# Patient Record
Sex: Female | Born: 1935 | Race: White | Hispanic: No | Smoking: Never smoker
Health system: Southern US, Community
[De-identification: ages and names within clinical notes are randomized; demographics above are authoritative.]

## PROBLEM LIST (undated history)

## (undated) DIAGNOSIS — I639 Cerebral infarction, unspecified: Secondary | ICD-10-CM

## (undated) DIAGNOSIS — I809 Phlebitis and thrombophlebitis of unspecified site: Secondary | ICD-10-CM

## (undated) DIAGNOSIS — F0390 Unspecified dementia without behavioral disturbance: Secondary | ICD-10-CM

## (undated) DIAGNOSIS — M1990 Unspecified osteoarthritis, unspecified site: Secondary | ICD-10-CM

## (undated) DIAGNOSIS — K5900 Constipation, unspecified: Secondary | ICD-10-CM

## (undated) DIAGNOSIS — E079 Disorder of thyroid, unspecified: Secondary | ICD-10-CM

## (undated) DIAGNOSIS — I82409 Acute embolism and thrombosis of unspecified deep veins of unspecified lower extremity: Secondary | ICD-10-CM

## (undated) HISTORY — PX: ABDOMINAL HYSTERECTOMY: SHX81

---

## 2014-07-12 ENCOUNTER — Emergency Department (HOSPITAL_COMMUNITY)
Admission: EM | Admit: 2014-07-12 | Discharge: 2014-07-13 | Disposition: A | Discharge status: Home / Self Care | Payer: Medicare Other | Attending: Emergency Medicine | Admitting: Emergency Medicine

## 2014-07-12 ENCOUNTER — Encounter (HOSPITAL_COMMUNITY): Payer: Self-pay | Admitting: Emergency Medicine

## 2014-07-12 DIAGNOSIS — Z7982 Long term (current) use of aspirin: Secondary | ICD-10-CM | POA: Diagnosis not present

## 2014-07-12 DIAGNOSIS — Z8679 Personal history of other diseases of the circulatory system: Secondary | ICD-10-CM | POA: Insufficient documentation

## 2014-07-12 DIAGNOSIS — M7989 Other specified soft tissue disorders: Secondary | ICD-10-CM | POA: Diagnosis not present

## 2014-07-12 DIAGNOSIS — E079 Disorder of thyroid, unspecified: Secondary | ICD-10-CM | POA: Diagnosis not present

## 2014-07-12 DIAGNOSIS — Z880 Allergy status to penicillin: Secondary | ICD-10-CM | POA: Insufficient documentation

## 2014-07-12 DIAGNOSIS — Z8673 Personal history of transient ischemic attack (TIA), and cerebral infarction without residual deficits: Secondary | ICD-10-CM | POA: Diagnosis not present

## 2014-07-12 DIAGNOSIS — F0390 Unspecified dementia without behavioral disturbance: Secondary | ICD-10-CM | POA: Diagnosis not present

## 2014-07-12 DIAGNOSIS — M129 Arthropathy, unspecified: Secondary | ICD-10-CM | POA: Insufficient documentation

## 2014-07-12 DIAGNOSIS — Z8719 Personal history of other diseases of the digestive system: Secondary | ICD-10-CM | POA: Diagnosis not present

## 2014-07-12 DIAGNOSIS — Z79899 Other long term (current) drug therapy: Secondary | ICD-10-CM | POA: Insufficient documentation

## 2014-07-12 HISTORY — DX: Unspecified dementia, unspecified severity, without behavioral disturbance, psychotic disturbance, mood disturbance, and anxiety: F03.90

## 2014-07-12 HISTORY — DX: Phlebitis and thrombophlebitis of unspecified site: I80.9

## 2014-07-12 HISTORY — DX: Cerebral infarction, unspecified: I63.9

## 2014-07-12 HISTORY — DX: Disorder of thyroid, unspecified: E07.9

## 2014-07-12 HISTORY — DX: Constipation, unspecified: K59.00

## 2014-07-12 HISTORY — DX: Unspecified osteoarthritis, unspecified site: M19.90

## 2014-07-12 LAB — COMPREHENSIVE METABOLIC PANEL
ALT: 18 U/L (ref 0–35)
AST: 16 U/L (ref 0–37)
Albumin: 3.4 g/dL — ABNORMAL LOW (ref 3.5–5.2)
Alkaline Phosphatase: 80 U/L (ref 39–117)
Anion gap: 12 (ref 5–15)
BUN: 18 mg/dL (ref 6–23)
CO2: 25 mEq/L (ref 19–32)
Calcium: 9.5 mg/dL (ref 8.4–10.5)
Chloride: 102 mEq/L (ref 96–112)
Creatinine, Ser: 1.14 mg/dL — ABNORMAL HIGH (ref 0.50–1.10)
GFR calc Af Amer: 49 mL/min — ABNORMAL LOW (ref >90)
GFR calc non Af Amer: 42 mL/min — ABNORMAL LOW (ref >90)
Glucose, Bld: 118 mg/dL — ABNORMAL HIGH (ref 70–99)
Potassium: 4.6 mEq/L (ref 3.7–5.3)
Sodium: 139 mEq/L (ref 137–147)
Total Bilirubin: 0.4 mg/dL (ref 0.3–1.2)
Total Protein: 7.1 g/dL (ref 6.0–8.3)

## 2014-07-12 LAB — CBC WITH DIFFERENTIAL/PLATELET
Basophils Absolute: 0 10*3/uL (ref 0.0–0.1)
Basophils Relative: 0 % (ref 0–1)
Eosinophils Absolute: 0.3 10*3/uL (ref 0.0–0.7)
Eosinophils Relative: 2 % (ref 0–5)
HCT: 38.6 % (ref 36.0–46.0)
Hemoglobin: 13.3 g/dL (ref 12.0–15.0)
Lymphocytes Relative: 21 % (ref 12–46)
Lymphs Abs: 3.9 10*3/uL (ref 0.7–4.0)
MCH: 28.8 pg (ref 26.0–34.0)
MCHC: 34.5 g/dL (ref 30.0–36.0)
MCV: 83.5 fL (ref 78.0–100.0)
Monocytes Absolute: 1.6 10*3/uL — ABNORMAL HIGH (ref 0.1–1.0)
Monocytes Relative: 9 % (ref 3–12)
Neutro Abs: 12.5 10*3/uL — ABNORMAL HIGH (ref 1.7–7.7)
Neutrophils Relative %: 68 % (ref 43–77)
Platelets: 266 10*3/uL (ref 150–400)
RBC: 4.62 MIL/uL (ref 3.87–5.11)
RDW: 14.7 % (ref 11.5–15.5)
WBC: 18.4 10*3/uL — ABNORMAL HIGH (ref 4.0–10.5)

## 2014-07-12 LAB — D-DIMER, QUANTITATIVE: D-Dimer, Quant: 3.76 ug/mL-FEU — ABNORMAL HIGH (ref 0.00–0.48)

## 2014-07-12 MED ORDER — DOXYCYCLINE HYCLATE 100 MG PO CAPS: 100.0000 mg | ORAL_CAPSULE | Freq: Two times a day (BID) | ORAL | Status: DC

## 2014-07-12 MED ORDER — CLINDAMYCIN HCL 300 MG PO CAPS
300.0000 mg | ORAL_CAPSULE | Freq: Once | ORAL | Status: AC
  Administered 2014-07-12: 300 mg via ORAL
  Filled 2014-07-12: qty 1

## 2014-07-12 MED ORDER — CLINDAMYCIN HCL 150 MG PO CAPS: 150.0000 mg | ORAL_CAPSULE | Freq: Four times a day (QID) | ORAL | Status: DC

## 2014-07-12 NOTE — ED Provider Notes (Signed)
CSN: [id number]     Arrival date & time 07/12/14  2130 History   First MD Initiated Contact with Patient 07/12/14 2141     Chief Complaint  Patient presents with  . Joint Swelling    right     (Consider location/radiation/quality/duration/timing/severity/associated sxs/prior Treatment) HPI  Laura Bird with R foot/leg swelling, erythema. No known trauma. Gradual onset ~3 days ago and progressively worsening. Pt has hx of dementia and unclear if having pain. No reported fever. No cough. Past hx lists thrombophlebitis. No old records in our system. Family with some past medical records. I didn't locate further details in cursory review.   Past Medical History  Diagnosis Date  . Arthritis   . Dementia   . Stroke   . Constipation   . Thrombophlebitis   . Thyroid disease     hyperpara  . DJD (degenerative joint disease)    Past Surgical History  Procedure Laterality Date  . Abdominal hysterectomy     No family history on file. History  Substance Use Topics  . Smoking status: Never Smoker   . Smokeless tobacco: Never Used  . Alcohol Use: No   OB History   Grav Para Term Preterm Abortions TAB SAB Ect Mult Living                 Review of Systems  Level 5 caveat because of dementia  Allergies  Codeine and Penicillins  Home Medications   Prior to Admission medications   Medication Sig Start Date End Date Taking? Authorizing Provider  acetaminophen (TYLENOL) 650 MG CR tablet Take 650 mg by mouth every 8 (eight) hours as needed for pain.   Yes Historical Provider, MD  aspirin EC 81 MG tablet Take 81 mg by mouth daily.   Yes Historical Provider, MD  calcium carbonate (TUMS - DOSED IN MG ELEMENTAL CALCIUM) 500 MG chewable tablet Chew 1 tablet by mouth 2 (two) times daily.   Yes Historical Provider, MD  cholecalciferol (VITAMIN D) 1000 UNITS tablet Take 1,000 Units by mouth daily.   Yes Historical Provider, MD  levothyroxine (SYNTHROID, LEVOTHROID) 112 MCG tablet Take 112 mcg by  mouth daily before breakfast. Monday, Wednesday, Friday and Saturday.   Yes Historical Provider, MD  loratadine (CLARITIN) 10 MG tablet Take 10 mg by mouth daily.   Yes Historical Provider, MD  LORazepam (ATIVAN) 0.5 MG tablet Take 0.5 mg by mouth every 8 (eight) hours as needed for anxiety.   Yes Historical Provider, MD  Melatonin 3 MG CAPS Take 3 mg by mouth at bedtime.   Yes Historical Provider, MD  Multiple Vitamin (MULTIVITAMIN WITH MINERALS) TABS tablet Take 1 tablet by mouth 2 (two) times daily.   Yes Historical Provider, MD  Omega-3 Fatty Acids (FISH OIL) 1200 MG CAPS Take 1,200 mg by mouth daily.   Yes Historical Provider, MD  polyethylene glycol (MIRALAX / GLYCOLAX) packet Take 17 g by mouth daily.   Yes Historical Provider, MD  sertraline (ZOLOFT) 50 MG tablet Take 50 mg by mouth daily.   Yes Historical Provider, MD  simvastatin (ZOCOR) 20 MG tablet Take 20 mg by mouth daily.   Yes Historical Provider, MD  Turmeric Curcumin 500 MG CAPS Take 500 mg by mouth daily.   Yes Historical Provider, MD  vitamin E 400 UNIT capsule Take 400 Units by mouth daily.   Yes Historical Provider, MD   BP 167/89  Pulse 96  Temp(Src) 97.6 F (36.4 C) (Oral)  Resp 16  SpO2 95%  Physical Exam  Nursing note and vitals reviewed. Constitutional: She appears well-developed and well-nourished. She appears distressed.  HENT:  Head: Normocephalic and atraumatic.  Eyes: Conjunctivae are normal. Right eye exhibits no discharge. Left eye exhibits no discharge.  Neck: Neck supple.  Cardiovascular: Normal rate, regular rhythm and normal heart sounds.  Exam reveals no gallop and no friction rub.   No murmur heard. Pulmonary/Chest: Effort normal and breath sounds normal. No respiratory distress.  Abdominal: Soft. She exhibits no distension. There is no tenderness.  Musculoskeletal: She exhibits no edema and no tenderness.  Skin: Skin is warm and dry.  Faint erythema to R foot and distal shin. Poorly demarcated.   Increased warmth.  Swelling from calf distally. Doesn't seem tender? Will occasionally withdraw foot during exam but does the same when examining the L side as well. Feet appear well perfused. Palpable DP pulses.   Psychiatric: She has a normal mood and affect.    ED Course  Procedures (including critical care time) Labs Review Labs Reviewed  COMPREHENSIVE METABOLIC PANEL - Abnormal; Notable for the following:    Glucose, Bld 118 (*)    Creatinine, Ser 1.14 (*)    Albumin 3.4 (*)    GFR calc non Af Amer 42 (*)    GFR calc Af Amer 49 (*)    All other components within normal limits  CBC WITH DIFFERENTIAL - Abnormal; Notable for the following:    WBC 18.4 (*)    Neutro Abs 12.5 (*)    Monocytes Absolute 1.6 (*)    All other components within normal limits  D-DIMER, QUANTITATIVE - Abnormal; Notable for the following:    D-Dimer, Quant 3.76 (*)    All other components within normal limits  TSH    Imaging Review No results found.   EKG Interpretation None      MDM   Final diagnoses:  Right leg swelling    Laura Bird with atraumatic RLE erythema, swelling. DVT versus cellulitis. Unfortunately unable to obtain US at this hour. Family understandably frustrated. Spoke with PCP, Dr Herschel [place]. Very difficult to get pt out of house and actually seen during house calls. Will get a d-dimer. Could potentially avoid [place] and can additionally obtain labs PCP requesting. Family understands needs [place] if dimer elevated. +/- on empiric lovenox until then.  Will cover with abx for possible cellulitis although not clinically convincing. PCP requesting xarelto if subsequent [place] comes back positive because will be unable to feasibly monitor INR. With age/signs of possible DVT cannot use Well's/PERC with regards to possible PE. With dementia, unable to get any reliable ROS. O2 sats ok. Resting HR somewhat high in 90s, but no tachycardia. Clinically she does not appear distressed.   Leukocytosis.  Nonspecific. Covering with abx though. No signs of abscess. Clindamycin for adequate strep coverage with her PCN allergy.   D-dimer significantly elevated. To get [place] tomorrow morning at [facility].     Raeford Razor, MD 07/12/14 [phone]

## 2014-07-12 NOTE — ED Notes (Signed)
Per EMS pt started having R ankle/calf swelling 3 days ago, states this happens at times, called MD today and was told to come here and get an Xray, was told the order had already been called in, denies any fall or injury to ankle. Pt has hx of dementia. Dr. Erick Blinks is who called in xray order.

## 2014-07-12 NOTE — Discharge Instructions (Signed)
Go to [facility] tomorrow for ultrasound of your right leg. Take antibiotics as prescribed.

## 2014-07-12 NOTE — ED Notes (Signed)
Bed: ZO10 Expected date:  Expected time:  Means of arrival:  Comments: EMS ankle fx - elderly

## 2014-07-12 NOTE — ED Notes (Signed)
Per family, pt was sent here to have a doppler study of R lower extremity, pt does have swelling and redness noted to R ankle, warm to touch.

## 2014-07-13 ENCOUNTER — Ambulatory Visit (HOSPITAL_COMMUNITY)
Admission: RE | Admit: 2014-07-13 | Discharge: 2014-07-13 | Disposition: A | Discharge status: Home / Self Care | Payer: Medicare Other | Source: Ambulatory Visit | Attending: Emergency Medicine | Admitting: Emergency Medicine

## 2014-07-13 DIAGNOSIS — I803 Phlebitis and thrombophlebitis of lower extremities, unspecified: Secondary | ICD-10-CM | POA: Insufficient documentation

## 2014-07-13 LAB — TSH: TSH: 5.62 u[IU]/mL — ABNORMAL HIGH (ref 0.350–4.500)

## 2014-07-13 NOTE — Progress Notes (Signed)
VASCULAR LAB PRELIMINARY  PRELIMINARY  PRELIMINARY  PRELIMINARY  Right lower extremity venous duplex completed.    Preliminary report:  Right:  DVT noted from the mid femoral vein through the popliteal vein and in the posterior tibial vein.  Unable to evaluate peroneal vein adequately due to patient non compliance.  No evidence of superficial thrombosis.  No Baker's cyst.  Maile Linford, RVT 07/13/2014, 10:55 AM

## 2015-12-23 ENCOUNTER — Emergency Department (HOSPITAL_COMMUNITY): Payer: Medicare Other

## 2015-12-23 ENCOUNTER — Encounter (HOSPITAL_COMMUNITY): Payer: Self-pay

## 2015-12-23 ENCOUNTER — Inpatient Hospital Stay (HOSPITAL_COMMUNITY)
Admission: EM | Admit: 2015-12-23 | Discharge: 2015-12-26 | DRG: 871 | Disposition: A | Discharge status: Home / Self Care | Payer: Medicare Other | Attending: Internal Medicine | Admitting: Internal Medicine

## 2015-12-23 DIAGNOSIS — B9620 Unspecified Escherichia coli [E. coli] as the cause of diseases classified elsewhere: Secondary | ICD-10-CM | POA: Diagnosis present

## 2015-12-23 DIAGNOSIS — E039 Hypothyroidism, unspecified: Secondary | ICD-10-CM | POA: Diagnosis present

## 2015-12-23 DIAGNOSIS — J189 Pneumonia, unspecified organism: Secondary | ICD-10-CM | POA: Diagnosis not present

## 2015-12-23 DIAGNOSIS — A4189 Other specified sepsis: Secondary | ICD-10-CM | POA: Diagnosis not present

## 2015-12-23 DIAGNOSIS — N390 Urinary tract infection, site not specified: Secondary | ICD-10-CM | POA: Diagnosis present

## 2015-12-23 DIAGNOSIS — Z7901 Long term (current) use of anticoagulants: Secondary | ICD-10-CM

## 2015-12-23 DIAGNOSIS — A419 Sepsis, unspecified organism: Secondary | ICD-10-CM | POA: Diagnosis not present

## 2015-12-23 DIAGNOSIS — E872 Acidosis, unspecified: Secondary | ICD-10-CM

## 2015-12-23 DIAGNOSIS — F0150 Vascular dementia without behavioral disturbance: Secondary | ICD-10-CM | POA: Diagnosis present

## 2015-12-23 DIAGNOSIS — J101 Influenza due to other identified influenza virus with other respiratory manifestations: Secondary | ICD-10-CM | POA: Diagnosis present

## 2015-12-23 DIAGNOSIS — F0391 Unspecified dementia with behavioral disturbance: Secondary | ICD-10-CM

## 2015-12-23 DIAGNOSIS — E785 Hyperlipidemia, unspecified: Secondary | ICD-10-CM | POA: Diagnosis present

## 2015-12-23 DIAGNOSIS — Z86718 Personal history of other venous thrombosis and embolism: Secondary | ICD-10-CM

## 2015-12-23 DIAGNOSIS — I82409 Acute embolism and thrombosis of unspecified deep veins of unspecified lower extremity: Secondary | ICD-10-CM

## 2015-12-23 DIAGNOSIS — J1008 Influenza due to other identified influenza virus with other specified pneumonia: Secondary | ICD-10-CM | POA: Diagnosis present

## 2015-12-23 DIAGNOSIS — Z8673 Personal history of transient ischemic attack (TIA), and cerebral infarction without residual deficits: Secondary | ICD-10-CM | POA: Diagnosis not present

## 2015-12-23 DIAGNOSIS — F0390 Unspecified dementia without behavioral disturbance: Secondary | ICD-10-CM

## 2015-12-23 HISTORY — DX: Acute embolism and thrombosis of unspecified deep veins of unspecified lower extremity: I82.409

## 2015-12-23 LAB — URINALYSIS, ROUTINE W REFLEX MICROSCOPIC
BILIRUBIN URINE: NEGATIVE
Bilirubin Urine: NEGATIVE
GLUCOSE, UA: NEGATIVE mg/dL
GLUCOSE, UA: NEGATIVE mg/dL
Hgb urine dipstick: NEGATIVE
Hgb urine dipstick: NEGATIVE
Ketones, ur: NEGATIVE mg/dL
Ketones, ur: NEGATIVE mg/dL
Leukocytes, UA: NEGATIVE
NITRITE: NEGATIVE
NITRITE: NEGATIVE
PH: 8 (ref 5.0–8.0)
PROTEIN: NEGATIVE mg/dL
Protein, ur: NEGATIVE mg/dL
Specific Gravity, Urine: 1.018 (ref 1.005–1.030)
Specific Gravity, Urine: 1.016 (ref 1.005–1.030)
pH: 8.5 — ABNORMAL HIGH (ref 5.0–8.0)

## 2015-12-23 LAB — HEPATIC FUNCTION PANEL
ALT: 32 U/L (ref 14–54)
AST: 43 U/L — ABNORMAL HIGH (ref 15–41)
Albumin: 3.8 g/dL (ref 3.5–5.0)
Alkaline Phosphatase: 61 U/L (ref 38–126)
BILIRUBIN TOTAL: 0.3 mg/dL (ref 0.3–1.2)
Total Protein: 6.7 g/dL (ref 6.5–8.1)

## 2015-12-23 LAB — BASIC METABOLIC PANEL
ANION GAP: 14 (ref 5–15)
BUN: 13 mg/dL (ref 6–20)
CALCIUM: 9.5 mg/dL (ref 8.9–10.3)
CO2: 23 mmol/L (ref 22–32)
Chloride: 103 mmol/L (ref 101–111)
Creatinine, Ser: 0.82 mg/dL (ref 0.44–1.00)
GFR calc non Af Amer: >60 mL/min (ref >60)
GLUCOSE: 111 mg/dL — AB (ref 65–99)
Potassium: 4.6 mmol/L (ref 3.5–5.1)
Sodium: 140 mmol/L (ref 135–145)

## 2015-12-23 LAB — I-STAT CG4 LACTIC ACID, ED
LACTIC ACID, VENOUS: 4.33 mmol/L — AB (ref 0.5–2.0)
Lactic Acid, Venous: 3.35 mmol/L (ref 0.5–2.0)

## 2015-12-23 LAB — CBC WITH DIFFERENTIAL/PLATELET
BASOS ABS: 0 10*3/uL (ref 0.0–0.1)
BASOS PCT: 0 %
Eosinophils Absolute: 0.1 10*3/uL (ref 0.0–0.7)
Eosinophils Relative: 1 %
HEMATOCRIT: 43.5 % (ref 36.0–46.0)
HEMOGLOBIN: 14.6 g/dL (ref 12.0–15.0)
LYMPHS PCT: 6 %
Lymphs Abs: 0.7 10*3/uL (ref 0.7–4.0)
MCH: 28.4 pg (ref 26.0–34.0)
MCHC: 33.6 g/dL (ref 30.0–36.0)
MCV: 84.6 fL (ref 78.0–100.0)
MONOS PCT: 7 %
Monocytes Absolute: 1 10*3/uL (ref 0.1–1.0)
NEUTROS ABS: 11.5 10*3/uL — AB (ref 1.7–7.7)
Neutrophils Relative %: 86 %
Platelets: 288 10*3/uL (ref 150–400)
RBC: 5.14 MIL/uL — ABNORMAL HIGH (ref 3.87–5.11)
RDW: 14.6 % (ref 11.5–15.5)
WBC: 13.3 10*3/uL — ABNORMAL HIGH (ref 4.0–10.5)

## 2015-12-23 LAB — URINE MICROSCOPIC-ADD ON: RBC / HPF: NONE SEEN RBC/hpf (ref 0–5)

## 2015-12-23 MED ORDER — SODIUM CHLORIDE 0.9 % IV BOLUS (SEPSIS)
500.0000 mL | INTRAVENOUS | Status: AC
  Administered 2015-12-23: 500 mL via INTRAVENOUS

## 2015-12-23 MED ORDER — VANCOMYCIN HCL IN DEXTROSE 1-5 GM/200ML-% IV SOLN
1000.0000 mg | INTRAVENOUS | Status: DC
  Administered 2015-12-23: 1000 mg via INTRAVENOUS
  Filled 2015-12-23 (×2): qty 200

## 2015-12-23 MED ORDER — SERTRALINE HCL 50 MG PO TABS
75.0000 mg | ORAL_TABLET | Freq: Every day | ORAL | Status: DC
  Administered 2015-12-23: 75 mg via ORAL
  Filled 2015-12-23: qty 2

## 2015-12-23 MED ORDER — SIMVASTATIN 20 MG PO TABS
20.0000 mg | ORAL_TABLET | Freq: Every day | ORAL | Status: DC
  Administered 2015-12-23: 20 mg via ORAL
  Filled 2015-12-23: qty 1

## 2015-12-23 MED ORDER — LEVOFLOXACIN IN D5W 750 MG/150ML IV SOLN: 750.0000 mg | Freq: Once | INTRAVENOUS | Status: DC

## 2015-12-23 MED ORDER — SODIUM CHLORIDE 0.9 % IV BOLUS (SEPSIS)
1000.0000 mL | INTRAVENOUS | Status: AC
  Administered 2015-12-23 (×2): 1000 mL via INTRAVENOUS

## 2015-12-23 MED ORDER — LEVOFLOXACIN IN D5W 750 MG/150ML IV SOLN
750.0000 mg | INTRAVENOUS | Status: DC
  Administered 2015-12-23: 750 mg via INTRAVENOUS
  Filled 2015-12-23: qty 150

## 2015-12-23 MED ORDER — VANCOMYCIN HCL IN DEXTROSE 1-5 GM/200ML-% IV SOLN: 1000.0000 mg | Freq: Once | INTRAVENOUS | Status: DC

## 2015-12-23 MED ORDER — DEXTROSE 5 % IV SOLN
2.0000 g | Freq: Once | INTRAVENOUS | Status: AC
  Administered 2015-12-23: 2 g via INTRAVENOUS
  Filled 2015-12-23: qty 2

## 2015-12-23 MED ORDER — LORAZEPAM 1 MG PO TABS
0.5000 mg | ORAL_TABLET | Freq: Once | ORAL | Status: AC
  Administered 2015-12-23: 0.5 mg via ORAL
  Filled 2015-12-23: qty 1

## 2015-12-23 NOTE — ED Provider Notes (Signed)
CSN: [id number]     Arrival date & time 12/23/15  1526 History   First MD Initiated Contact with Patient 12/23/15 1534     Chief Complaint  Patient presents with  . Altered Mental Status     (Consider location/radiation/quality/duration/timing/severity/associated sxs/prior Treatment) Patient is a 82 y.o. female presenting with altered mental status.  Altered Mental Status Presenting symptoms: behavior changes and confusion   Severity:  Moderate Most recent episode:  Today Episode history:  Continuous Duration:  3 hours Timing:  Constant Progression:  Unchanged Chronicity:  New Context: dementia   Context: not head injury, not a recent illness and not a recent infection   Associated symptoms: no seizures and no vomiting     Past Medical History  Diagnosis Date  . Arthritis   . Dementia   . Stroke (HCC)   . Constipation   . Thrombophlebitis   . Thyroid disease     hyperpara  . DJD (degenerative joint disease)    Past Surgical History  Procedure Laterality Date  . Abdominal hysterectomy     No family history on file. Social History  Substance Use Topics  . Smoking status: Never Smoker   . Smokeless tobacco: Never Used  . Alcohol Use: No   OB History    No data available     Review of Systems  Unable to perform ROS: Mental status change  Gastrointestinal: Negative for vomiting.  Neurological: Negative for seizures.  Psychiatric/Behavioral: Positive for confusion.      Allergies  Codeine and Penicillins  Home Medications   Prior to Admission medications   Medication Sig Start Date End Date Taking? Authorizing Provider  cetirizine (ZYRTEC) 10 MG tablet Take 10 mg by mouth daily.   Yes Historical Provider, MD  chlorhexidine (PERIDEX) 0.12 % solution Use as directed 5 mLs in the mouth or throat 2 (two) times daily. 10/27/15  Yes Historical Provider, MD  cholecalciferol (VITAMIN D) 1000 UNITS tablet Take 1,000 Units by mouth daily.   Yes Historical Provider,  MD  levothyroxine (SYNTHROID, LEVOTHROID) 112 MCG tablet Take 112 mcg by mouth daily before breakfast. Monday, Wednesday, Friday and Saturday.   Yes Historical Provider, MD  LORazepam (ATIVAN) 0.5 MG tablet Take 0.5-1 mg by mouth 4 (four) times daily. Take 0.5 mg in the morning, Take 1 mg at noon. Take 0.5 in the afternoon and evening.   Yes Historical Provider, MD  Melatonin 3 MG CAPS Take 3 mg by mouth at bedtime.   Yes Historical Provider, MD  Omega-3 Fatty Acids (FISH OIL) 1200 MG CAPS Take 1,200 mg by mouth daily.   Yes Historical Provider, MD  polyethylene glycol (MIRALAX / GLYCOLAX) packet Take 17 g by mouth daily.   Yes Historical Provider, MD  sertraline (ZOLOFT) 50 MG tablet Take 75 mg by mouth daily at 6 PM.    Yes Historical Provider, MD  simvastatin (ZOCOR) 20 MG tablet Take 20 mg by mouth daily at 6 PM.    Yes Historical Provider, MD  vitamin E 400 UNIT capsule Take 400 Units by mouth daily.   Yes Historical Provider, MD  XARELTO 20 MG TABS tablet Take 20 mg by mouth daily at 12 noon. 11/29/15  Yes Historical Provider, MD   BP 135/104 mmHg  Pulse 104  Temp(Src) 99.1 F (37.3 C) (Oral)  Resp 16  SpO2 96% Physical Exam  Constitutional: She appears well-developed and well-nourished. No distress.  HENT:  Head: Normocephalic and atraumatic.  Right Ear: External ear normal.  Left Ear: External ear normal.  Nose: Nose normal.  Eyes: Conjunctivae and EOM are normal. Pupils are equal, round, and reactive to light. Right eye exhibits no discharge. Left eye exhibits no discharge. No scleral icterus.  Neck: Normal range of motion. Neck supple.  Cardiovascular: Normal rate, regular rhythm and normal heart sounds.  Exam reveals no gallop and no friction rub.   No murmur heard. Pulmonary/Chest: Effort normal and breath sounds normal. No stridor. No respiratory distress. She has no wheezes.  Abdominal: Soft. She exhibits no distension. There is no tenderness.  Musculoskeletal: She exhibits  no edema or tenderness.  Neurological: She is alert. She is disoriented. GCS eye subscore is 4. GCS verbal subscore is 4. GCS motor subscore is 5.  Neurologic exam limited by patient's mental status. Patient is moving all extremities with 2+ reflexes.   Skin: Skin is warm and dry. No rash noted. She is not diaphoretic. No erythema.  Psychiatric: She has a normal mood and affect.    ED Course  Procedures (including critical care time) Labs Review Labs Reviewed  CBC WITH DIFFERENTIAL/PLATELET - Abnormal; Notable for the following:    WBC 13.3 (*)    RBC 5.14 (*)    Neutro Abs 11.5 (*)    All other components within normal limits  BASIC METABOLIC PANEL - Abnormal; Notable for the following:    Glucose, Bld 111 (*)    All other components within normal limits  HEPATIC FUNCTION PANEL - Abnormal; Notable for the following:    AST 43 (*)    Bilirubin, Direct <0.1 (*)    All other components within normal limits  URINALYSIS, ROUTINE W REFLEX MICROSCOPIC (NOT AT [facility]) - Abnormal; Notable for the following:    APPearance TURBID (*)    pH 8.5 (*)    Leukocytes, UA TRACE (*)    All other components within normal limits  URINE MICROSCOPIC-ADD ON - Abnormal; Notable for the following:    Squamous Epithelial / LPF 0-5 (*)    Bacteria, UA MANY (*)    All other components within normal limits  I-STAT CG4 LACTIC ACID, ED - Abnormal; Notable for the following:    Lactic Acid, Venous 4.33 (*)    All other components within normal limits  I-STAT CG4 LACTIC ACID, ED - Abnormal; Notable for the following:    Lactic Acid, Venous 3.35 (*)    All other components within normal limits  URINE CULTURE  CULTURE, BLOOD (ROUTINE X 2)  CULTURE, BLOOD (ROUTINE X 2)  URINALYSIS, ROUTINE W REFLEX MICROSCOPIC (NOT AT [facility])  INFLUENZA PANEL BY PCR (TYPE A & B, H1N1)    Imaging Review Dg Chest 2 View  12/23/2015  CLINICAL DATA:  Pt has a hx of stroke and dementia. Care giver reports that pt has not been  acting her norm since she woke up. no cough or congestion. No fever. No n/v. No apparent pain or resp difficulty. EXAM: CHEST  2 VIEW COMPARISON:  None. FINDINGS: Cardiac silhouette is normal in size. No mediastinal or hilar masses or evidence of adenopathy. Lungs are clear.  No pleural effusion or pneumothorax. Bony thorax is demineralized but grossly intact. IMPRESSION: No acute cardiopulmonary disease. Electronically Signed   By: Amie Portland M.D.   On: 12/23/2015 18:03   Ct Head Wo Contrast  12/23/2015  CLINICAL DATA:  Altered mental status, borderline dementia EXAM: CT HEAD WITHOUT CONTRAST TECHNIQUE: Contiguous axial images were obtained from the base of the skull through the vertex  without intravenous contrast. COMPARISON:  None. FINDINGS: There is atrophy and chronic small vessel disease changes. No acute intracranial abnormality. Specifically, no hemorrhage, hydrocephalus, mass lesion, acute infarction, or significant intracranial injury. No acute calvarial abnormality. Visualized paranasal sinuses and mastoids clear. Orbital soft tissues unremarkable. IMPRESSION: No acute intracranial abnormality. Atrophy, chronic microvascular disease. Electronically Signed   By: Charlett Nose M.D.   On: 12/23/2015 16:58   I have personally reviewed and evaluated these images and lab results as part of my medical decision-making.   EKG Interpretation   Date/Time:  Friday December 23 2015 17:28:28 EST Ventricular Rate:  103 PR Interval:  157 QRS Duration: 88 QT Interval:  344 QTC Calculation: 450 R Axis:   71 Text Interpretation:  Sinus tachycardia Probable left atrial enlargement  Low voltage, precordial leads Borderline T abnormalities, anterior leads  Baseline wander in lead(s) V4 V5 No old tracing to compare Confirmed by  GOLDSTON  MD, SCOTT ([id number]) on 12/23/2015 5:33:35 PM      MDM   Altered mental status. Patient with strong urine smell concerning for UTI. Rectal temperature 102. Leukocytosis.  Lactic acid elevated. Code sepsis initiated. Patient given 30 mL/kg of IV fluids and empiric antibiotics.   Chest x-ray without evidence of pneumonia. CT head without acute abnormalities. UA initially completely normal however given the strong urine odor was repeated which revealed a trace of leukocytes with many bacteria. Influenza currently pending Presentation is not consistent with meningitis however cannot be ruled out at this point. Unfortunately patient is on Xarelto and we are unable to perform a lumbar puncture in the emergency department. The patient will require admission for continued workup and management.  Diagnostic studies were interpreted by me and use to my clinical decision-making. Next  Patient was seen in conjunction with Dr. Criss Alvine.  Final diagnoses:  Sepsis, due to unspecified organism (HCC)  Lactic acidosis        Drema Pry, MD 12/23/15 2351  Pricilla Loveless, MD 12/24/15 [phone]

## 2015-12-23 NOTE — H&P (Signed)
[facility] History and Physical  Laura CopaVivian Routzahn AVW:[id number] DOB: 03/01/1925 DOA: 12/23/2015  PCP: Pcp Not In System   Chief Complaint: Altered mental status and weakness  HPI: Laura Bird is a 82 y.o. woman with a history of CVA/TIA, vascular dementia, RLE DVT in 2015, and arthritis who continues to live in her home with 24-hour caregivers.  She presents to the ED accompanied by her daughter and one of her caregivers for evaluation of mental status changes and weakness.  At baseline, she spends most days on the couch or in a wheelchair.  She does not ambulate independently anymore.  She is not oriented to time but recognizes family, engages in social conversation, and is able to follow commands.  This afternoon, she demonstrated increased nasal congestion and new cough.  She became less responsive and seemed weaker, with decreased use of her right arm.  She would not follow commands.  The caregiver notified the patient's daughter, and the patient was ultimately brought to the hospital by EMS personnel.  Since being in the ED, she has spiked a fever to 102.  She has a leukocytosis and elevated lactic acid level.  Hospitalist asked to admit for further evaluation and management of sepsis.  Review of Systems: 12 systems reviewed and negative except as stated in HPI.  Past Medical History  Diagnosis Date  . Arthritis   . Dementia   . Stroke (HCC)   . Constipation   . Thrombophlebitis   . Thyroid disease     hyperpara  . DJD (degenerative joint disease)    Past Surgical History  Procedure Laterality Date  . Abdominal hysterectomy    Oral surgery  Social History:  Social History   Social History Narrative  She is a widow.  She has two adult children.  No tobacco, EtOH, or illicit drug use.  Allergies  Allergen Reactions  . Codeine Other (See Comments)    unknown  . Penicillins Other (See Comments)    Unknown  Has patient had a PCN reaction causing immediate rash,  facial/tongue/throat swelling, SOB or lightheadedness with hypotension: unknown Has patient had a PCN reaction causing severe rash involving mucus membranes or skin necrosis: unknown Has patient had a PCN reaction that required hospitalization unknown Has patient had a PCN reaction occurring within the last 10 years: unknown If all of the above answers are "NO", then may proceed with Cephalosporin use.   Family History: Brother had diabetes. Grandmother had a stroke.  Prior to Admission medications   Medication Sig Start Date End Date Taking? Authorizing Provider  cetirizine (ZYRTEC) 10 MG tablet Take 10 mg by mouth daily.   Yes Historical Provider, MD  chlorhexidine (PERIDEX) 0.12 % solution Use as directed 5 mLs in the mouth or throat 2 (two) times daily. 10/27/15  Yes Historical Provider, MD  cholecalciferol (VITAMIN D) 1000 UNITS tablet Take 1,000 Units by mouth daily.   Yes Historical Provider, MD  levothyroxine (SYNTHROID, LEVOTHROID) 112 MCG tablet Take 112 mcg by mouth daily before breakfast. Monday, Wednesday, Friday and Saturday.   Yes Historical Provider, MD  LORazepam (ATIVAN) 0.5 MG tablet Take 0.5-1 mg by mouth 4 (four) times daily. Take 0.5 mg in the morning, Take 1 mg at noon. Take 0.5 in the afternoon and evening.   Yes Historical Provider, MD  Melatonin 3 MG CAPS Take 3 mg by mouth at bedtime.   Yes Historical Provider, MD  Omega-3 Fatty Acids (FISH OIL) 1200 MG CAPS Take 1,200 mg by mouth  daily.   Yes Historical Provider, MD  polyethylene glycol (MIRALAX / GLYCOLAX) packet Take 17 g by mouth daily.   Yes Historical Provider, MD  sertraline (ZOLOFT) 50 MG tablet Take 75 mg by mouth daily at 6 PM.    Yes Historical Provider, MD  simvastatin (ZOCOR) 20 MG tablet Take 20 mg by mouth daily at 6 PM.    Yes Historical Provider, MD  vitamin E 400 UNIT capsule Take 400 Units by mouth daily.   Yes Historical Provider, MD  XARELTO 20 MG TABS tablet Take 20 mg by mouth daily at 12 noon.  11/29/15  Yes Historical Provider, MD   Physical Exam: Filed Vitals:   12/23/15 1801 12/23/15 1856 12/23/15 1900 12/23/15 1953  BP:  116/64 127/69 137/93  Pulse:  100 69 97  Temp:      TempSrc:      Resp:  Height:  (1.6 m)     Weight: 74.844 kg (165 lb)     SpO2:  91% 82% 100%     General:  Awake and alert but disoriented.  NAD.  Eyes: Patient is not compliant with eye exam but pupils appear equal, round, and reactive.  ENT: Moist mucous membranes.  Neck: Supple.  Cardiovascular: NR/RR.  I do not hear a murmur.  No LE edema.  Respiratory: CTA bilaterally listening anteriorly.  Abdomen: Says "no, don't do that" with palpation of her abdomen, LLQ fullness but abdomen is soft and compressible.  Bowel sounds are present.  She does not have an acute abdomen.  Skin: Warm and dry.  Musculoskeletal: Moves all four extremities spontaneously.  Psychiatric: Normal affect.  Neurologic: No focal deficits.  Labs on Admission:  Basic Metabolic Panel:  Recent Labs Lab 12/23/15 1650  NA 140  K 4.6  CL 103  CO2 23  GLUCOSE 111*  BUN 13  CREATININE 0.82  CALCIUM 9.5   Liver Function Tests:  Recent Labs Lab 12/23/15 1810  AST 43*  ALT 32  ALKPHOS 61  BILITOT 0.3  PROT 6.7  ALBUMIN 3.8   CBC:  Recent Labs Lab 12/23/15 1650  WBC 13.3*  NEUTROABS 11.5*  HGB 14.6  HCT 43.5  MCV 84.6  PLT 288   Radiological Exams on Admission: Dg Chest 2 View  12/23/2015  CLINICAL DATA:  Pt has a hx of stroke and dementia. Care giver reports that pt has not been acting her norm since she woke up. no cough or congestion. No fever. No n/v. No apparent pain or resp difficulty. EXAM: CHEST  2 VIEW COMPARISON:  None. FINDINGS: Cardiac silhouette is normal in size. No mediastinal or hilar masses or evidence of adenopathy. Lungs are clear.  No pleural effusion or pneumothorax. Bony thorax is demineralized but grossly intact. IMPRESSION: No acute cardiopulmonary disease.  Electronically Signed   By: Amie Portland M.D.   On: 12/23/2015 18:03   Ct Head Wo Contrast  12/23/2015  CLINICAL DATA:  Altered mental status, borderline dementia EXAM: CT HEAD WITHOUT CONTRAST TECHNIQUE: Contiguous axial images were obtained from the base of the skull through the vertex without intravenous contrast. COMPARISON:  None. FINDINGS: There is atrophy and chronic small vessel disease changes. No acute intracranial abnormality. Specifically, no hemorrhage, hydrocephalus, mass lesion, acute infarction, or significant intracranial injury. No acute calvarial abnormality. Visualized paranasal sinuses and mastoids clear. Orbital soft tissues unremarkable. IMPRESSION: No acute intracranial abnormality. Atrophy, chronic microvascular disease. Electronically Signed   By: Charlett Nose M.D.  On: 12/23/2015 16:58    EKG: Independently reviewed. No acute ST segment changes  Assessment/Plan Principal Problem:   Sepsis (HCC) Active Problems:   Lactic acidosis   Dementia   Hypothyroidism   DVT, lower extremity (HCC)   Admit to step down unit  Sepsis without shock, source of infection unclear --Repeat U/A pending, urine odor noted but first specimen was  Unremarkable.  Chest xray does not show acute infiltrate.   --Flu screen pending --Agree with empiric levaquin, aztreonam, vancomycin --Blood and urine cultures pending --Aggressive IV fluid resuscitation per protocol --Repeat lactic acid and check procalcitonin --HOLDING XARELTO in the event that she needs LP (it was considered in the ED but deferred because of the anti-coagulant) or other invasive procedure  History of CVA, vascular dementia --Continue home medications  Code Status: FULL Family Communication: Daughter, son-in-law, caregiver at bedside Disposition Plan: Expect her to be here at least two midnights  Time spent: 60 minutes  [employer] [facility]  12/23/2015, 9:16 PM

## 2015-12-23 NOTE — Progress Notes (Signed)
Pharmacy Antibiotic Note  Laura Bird is a 82 y.o. female admitted on 12/23/2015 with sepsis.  Pharmacy has been consulted for Vancomycin and Levaquin dosing. Pt also received aztreonam x1 in the ED. WBC 13.3, Tmax 102.1, CrCl ~6246mL/min    Plan: Vancomycin 1000mg  IV Q24h  Levaquin 750mg  Q48h  F/U c/s, renal fxn, VT@ss , LOT   Height: 5\' 3"  (160 cm) Weight: 165 lb (74.844 kg) IBW/kg (Calculated) : 52.4  Temp (24hrs), Avg:100.6 F (38.1 C), Min:99.1 F (37.3 C), Max:102.1 F (38.9 C)   Recent Labs Lab 12/23/15 1650 12/23/15 1740  WBC 13.3*  --   CREATININE 0.82  --   LATICACIDVEN  --  4.33*    Estimated Creatinine Clearance: 46 mL/min (by C-G formula based on Cr of 0.82).    Allergies  Allergen Reactions  . Codeine Other (See Comments)    unknown  . Penicillins Other (See Comments)    Unknown  Has patient had a PCN reaction causing immediate rash, facial/tongue/throat swelling, SOB or lightheadedness with hypotension: unknown Has patient had a PCN reaction causing severe rash involving mucus membranes or skin necrosis: unknown Has patient had a PCN reaction that required hospitalization unknown Has patient had a PCN reaction occurring within the last 10 years: unknown If all of the above answers are "NO", then may proceed with Cephalosporin use.    Antimicrobials this admission: 3/3 Vanc>>  3/3 levaquin>> 3/3 aztreonam>>  Goal Vanc trough 15-20   Thank you for allowing pharmacy to be a part of this patient's care.  Laura Bird, PharmD Pharmacy Resident  Pager: [phone] 12/23/2015 8:47 PM

## 2015-12-23 NOTE — ED Notes (Signed)
Pt. Has a hx of stroke and dementia.  Care giver reports that pt. Has not been acting her norm since she woke up.  Caregiver and daughter cannot explain why pt. Is different.  She repeats sentences and that is her norm  She also became agitated quickly which is  Her norm . Her rt side is weak from her previous stroke and she does not like her lt. Side touched.  She is  Very sensitive.  She is alert but is not oriented X3

## 2015-12-24 DIAGNOSIS — J189 Pneumonia, unspecified organism: Secondary | ICD-10-CM | POA: Diagnosis present

## 2015-12-24 DIAGNOSIS — A419 Sepsis, unspecified organism: Secondary | ICD-10-CM

## 2015-12-24 LAB — LACTIC ACID, PLASMA
Lactic Acid, Venous: 2.3 mmol/L (ref 0.5–2.0)
Lactic Acid, Venous: 1.8 mmol/L (ref 0.5–2.0)

## 2015-12-24 LAB — CBC
HEMATOCRIT: 35.4 % — AB (ref 36.0–46.0)
Hemoglobin: 11.9 g/dL — ABNORMAL LOW (ref 12.0–15.0)
MCH: 28.2 pg (ref 26.0–34.0)
MCHC: 33.6 g/dL (ref 30.0–36.0)
MCV: 83.9 fL (ref 78.0–100.0)
PLATELETS: 253 10*3/uL (ref 150–400)
RBC: 4.22 MIL/uL (ref 3.87–5.11)
RDW: 14.7 % (ref 11.5–15.5)
WBC: 8.2 10*3/uL (ref 4.0–10.5)

## 2015-12-24 LAB — BASIC METABOLIC PANEL
ANION GAP: 10 (ref 5–15)
BUN: 10 mg/dL (ref 6–20)
CALCIUM: 8 mg/dL — AB (ref 8.9–10.3)
CO2: 22 mmol/L (ref 22–32)
Chloride: 107 mmol/L (ref 101–111)
Creatinine, Ser: 0.86 mg/dL (ref 0.44–1.00)
GFR, EST NON AFRICAN AMERICAN: 59 mL/min — AB (ref >60)
Glucose, Bld: 123 mg/dL — ABNORMAL HIGH (ref 65–99)
Potassium: 4 mmol/L (ref 3.5–5.1)
SODIUM: 139 mmol/L (ref 135–145)

## 2015-12-24 LAB — INFLUENZA PANEL BY PCR (TYPE A & B)
H1N1FLUPCR: NOT DETECTED
INFLAPCR: NEGATIVE
Influenza B By PCR: POSITIVE — AB

## 2015-12-24 LAB — PROCALCITONIN: Procalcitonin: <0.1 ng/mL

## 2015-12-24 LAB — PROTIME-INR
INR: 1.96 — AB (ref 0.00–1.49)
Prothrombin Time: 22.3 seconds — ABNORMAL HIGH (ref 11.6–15.2)

## 2015-12-24 LAB — TSH: TSH: 0.79 u[IU]/mL (ref 0.350–4.500)

## 2015-12-24 LAB — APTT: APTT: 41 s — AB (ref 24–37)

## 2015-12-24 MED ORDER — POLYETHYLENE GLYCOL 3350 17 G PO PACK
17.0000 g | PACK | Freq: Every day | ORAL | Status: DC
  Administered 2015-12-24 – 2015-12-25 (×2): 17 g via ORAL
  Filled 2015-12-24 (×3): qty 1

## 2015-12-24 MED ORDER — SODIUM CHLORIDE 0.9 % IV SOLN
INTRAVENOUS | Status: DC
  Administered 2015-12-24 – 2015-12-25 (×2): via INTRAVENOUS

## 2015-12-24 MED ORDER — LORATADINE 10 MG PO TABS
10.0000 mg | ORAL_TABLET | Freq: Every day | ORAL | Status: DC
  Administered 2015-12-24 – 2015-12-26 (×3): 10 mg via ORAL
  Filled 2015-12-24 (×3): qty 1

## 2015-12-24 MED ORDER — ACETAMINOPHEN 650 MG RE SUPP: 650.0000 mg | Freq: Four times a day (QID) | RECTAL | Status: DC | PRN

## 2015-12-24 MED ORDER — LEVOTHYROXINE SODIUM 112 MCG PO TABS
112.0000 ug | ORAL_TABLET | ORAL | Status: DC
  Administered 2015-12-24 – 2015-12-26 (×2): 112 ug via ORAL
  Filled 2015-12-24 (×3): qty 1

## 2015-12-24 MED ORDER — SIMVASTATIN 20 MG PO TABS
20.0000 mg | ORAL_TABLET | Freq: Every day | ORAL | Status: DC
  Administered 2015-12-24 – 2015-12-25 (×2): 20 mg via ORAL
  Filled 2015-12-24 (×2): qty 1

## 2015-12-24 MED ORDER — AZTREONAM 1 G IJ SOLR
1.0000 g | Freq: Three times a day (TID) | INTRAMUSCULAR | Status: DC
  Administered 2015-12-24: 1 g via INTRAVENOUS
  Filled 2015-12-24 (×3): qty 1

## 2015-12-24 MED ORDER — OSELTAMIVIR PHOSPHATE 30 MG PO CAPS
30.0000 mg | ORAL_CAPSULE | Freq: Two times a day (BID) | ORAL | Status: DC
  Administered 2015-12-24 – 2015-12-26 (×5): 30 mg via ORAL
  Filled 2015-12-24 (×7): qty 1

## 2015-12-24 MED ORDER — RIVAROXABAN 20 MG PO TABS
20.0000 mg | ORAL_TABLET | Freq: Every day | ORAL | Status: DC
  Administered 2015-12-24 – 2015-12-25 (×2): 20 mg via ORAL
  Filled 2015-12-24 (×3): qty 1

## 2015-12-24 MED ORDER — ACETAMINOPHEN 325 MG PO TABS
650.0000 mg | ORAL_TABLET | Freq: Four times a day (QID) | ORAL | Status: DC | PRN
  Filled 2015-12-24: qty 2

## 2015-12-24 MED ORDER — CHLORHEXIDINE GLUCONATE 0.12 % MT SOLN
5.0000 mL | Freq: Two times a day (BID) | OROMUCOSAL | Status: DC
  Administered 2015-12-24 – 2015-12-26 (×4): 5 mL via OROMUCOSAL
  Filled 2015-12-24 (×7): qty 15

## 2015-12-24 MED ORDER — SODIUM CHLORIDE 0.9% FLUSH
3.0000 mL | Freq: Two times a day (BID) | INTRAVENOUS | Status: DC
  Administered 2015-12-24 – 2015-12-26 (×4): 3 mL via INTRAVENOUS

## 2015-12-24 MED ORDER — LORAZEPAM 1 MG PO TABS
0.5000 mg | ORAL_TABLET | Freq: Four times a day (QID) | ORAL | Status: DC
  Administered 2015-12-24: 0.5 mg via ORAL
  Administered 2015-12-24 – 2015-12-25 (×3): 1 mg via ORAL
  Administered 2015-12-25: 0.5 mg via ORAL
  Administered 2015-12-25 (×2): 1 mg via ORAL
  Administered 2015-12-26: 0.5 mg via ORAL
  Filled 2015-12-24 (×8): qty 1

## 2015-12-24 MED ORDER — MELATONIN 3 MG PO TABS
3.0000 mg | ORAL_TABLET | Freq: Every day | ORAL | Status: DC
  Administered 2015-12-25: 3 mg via ORAL
  Filled 2015-12-24 (×2): qty 1

## 2015-12-24 MED ORDER — SERTRALINE HCL 50 MG PO TABS
75.0000 mg | ORAL_TABLET | Freq: Every day | ORAL | Status: DC
  Administered 2015-12-24 – 2015-12-25 (×2): 75 mg via ORAL
  Filled 2015-12-24 (×2): qty 1

## 2015-12-24 NOTE — Progress Notes (Signed)
PHARMACY - XARELTO  On PTA for stroke and hx DVT Renal function normal Hb down to 11.9 - possible dilutional, no bleeding noted No plan for LP per Dr. Thedore MinsSingh  Plan: Resume Xarelto 20 mg PO daily with lunch as per home schedule Pharmacy signing off but will follow peripherally  [facility]ufkinJennifer East Bird, LuvernePharm.D., BCPS Clinical Pharmacist Pager: [phone] 12/24/2015 11:08 AM

## 2015-12-24 NOTE — ED Notes (Signed)
Dr. Thedore MinsSingh rounding on patient, reports pt can go to telemetry. Will change bed request, also ordered patient meal tray. Family provided update.

## 2015-12-24 NOTE — ED Notes (Signed)
Myself and Sarah, RN cleaned patient for patient had urinated and had a bowel movement on herself; changed stretcher linens and patient's gown; placed a diaper on patient and multiple chuks underneath patient; readjusted patient on stretcher and covered with a sheet; patient resting at this time; visitor at bedside during event

## 2015-12-24 NOTE — Progress Notes (Signed)
Patient Demographics:    Laura Bird, is a 82 y.o. female, DOB - 1925/02/26, ZOX:[id number]  Admit date - 12/23/2015   Admitting Physician Michael Litter, MD  Outpatient Primary MD for the patient is Pcp Not In System  LOS - 1   Chief Complaint  Patient presents with  . Altered Mental Status        Subjective:    Laura Bird today has, No headache, No chest pain, No abdominal pain - No Nausea, No new weakness tingling or numbness, No Cough - SOB.    Assessment  & Plan :     1.Sepsis upon admission due to influenza B infection. Chest x-ray clear, afebrile now, no leukocytosis, lactic acid has normalized after hydration, patient appears nontoxic, denies any headache, head CT nonacute, neck supple. Continue to monitor on Tamiflu. Stop all antibiotics.  2. Advanced dementia. Supportive care, at risk for delirium, mid MIs narcotics and benzodiazepines.  3. Right lower extremity DVT in 2015. On xaralto continue. Defer long-term continuation to PCP.  4. Hypothyroidism. On Synthroid continue.  5. Dyslipidemia. Continue home dose statin unchanged.    Code Status : Full  Family Communication  : Daughter bedside  Disposition Plan  : To be decided but likely home in 1-2 days  Consults  : None  Procedures  :   CT head and chest x-ray nonacute  DVT Prophylaxis  :  Xaralto  Lab Results  Component Value Date   PLT 253 12/24/2015    Inpatient Medications  Scheduled Meds: . chlorhexidine  5 mL Mouth/Throat BID  . levothyroxine  112 mcg Oral Once per day on Mon Wed Fri Sat  . loratadine  10 mg Oral Daily  . LORazepam  0.5-1 mg Oral QID  . Melatonin  3 mg Oral QHS  . oseltamivir  30 mg Oral BID  . polyethylene glycol  17 g Oral Daily  . rivaroxaban  20 mg Oral Q lunch  . sertraline  75 mg  Oral q1800  . simvastatin  20 mg Oral q1800  . sodium chloride flush  3 mL Intravenous Q12H   Continuous Infusions: . sodium chloride     PRN Meds:.acetaminophen **OR** acetaminophen  Antibiotics  :     Anti-infectives    Start     Dose/Rate Route Frequency Ordered Stop   12/24/15 0900  oseltamivir (TAMIFLU) capsule 30 mg    Comments:  Tamiflu 30 mg po BID for CrCl < 60 mL/min   30 mg Oral 2 times daily 12/24/15 0747 12/29/15 0959   12/24/15 0400  aztreonam (AZACTAM) 1 g in dextrose 5 % 50 mL IVPB  Status:  Discontinued     1 g 100 mL/hr over 30 Minutes Intravenous 3 times per day 12/24/15 0252 12/24/15 1210   12/23/15 1830  vancomycin (VANCOCIN) IVPB 1000 mg/200 mL premix  Status:  Discontinued     1,000 mg 200 mL/hr over 60 Minutes Intravenous Every 24 hours 12/23/15 1819 12/24/15 1210   12/23/15 1830  levofloxacin (LEVAQUIN) IVPB 750 mg  Status:  Discontinued     750 mg 100 mL/hr over 90 Minutes Intravenous Every 48 hours 12/23/15 1819 12/24/15 1210   12/23/15 1815  levofloxacin (LEVAQUIN) IVPB 750 mg  Status:  Discontinued  750 mg 100 mL/hr over 90 Minutes Intravenous  Once 12/23/15 1804 12/23/15 1820   12/23/15 1815  aztreonam (AZACTAM) 2 g in dextrose 5 % 50 mL IVPB     2 g 100 mL/hr over 30 Minutes Intravenous  Once 12/23/15 1804 12/23/15 2111   12/23/15 1815  vancomycin (VANCOCIN) IVPB 1000 mg/200 mL premix  Status:  Discontinued     1,000 mg 200 mL/hr over 60 Minutes Intravenous  Once 12/23/15 1804 12/23/15 1819        Objective:   Filed Vitals:   12/24/15 0700 12/24/15 0757 12/24/15 0924 12/24/15 1040  BP: 135/57  139/65 117/59  Pulse: 81  86 86  Temp:  99.9 F (37.7 C)  98.6 F (37 C)  TempSrc:  Rectal  Axillary  Resp:   25 20  Height:    5\' 2"  (1.575 m)  Weight:    77.747 kg (171 lb 6.4 oz)  SpO2: 100%  96% 95%    Wt Readings from Last 3 Encounters:  12/24/15 77.747 kg (171 lb 6.4 oz)     Intake/Output Summary (Last 24 hours) at 12/24/15  1212 Last data filed at 12/23/15 1725  Gross per 24 hour  Intake      0 ml  Output     75 ml  Net    -75 ml     Physical Exam  Awake but confused, No new F.N deficits, Normal affect [place].AT,PERRAL Supple Neck,No JVD, No cervical lymphadenopathy appriciated.  Symmetrical Chest wall movement, Good air movement bilaterally, CTAB RRR,No Gallops,Rubs or new Murmurs, No Parasternal Heave +ve B.Sounds, Abd Soft, No tenderness, No organomegaly appriciated, No rebound - guarding or rigidity. No Cyanosis, Clubbing or edema, No new Rash or bruise     Data Review:   Micro Results Recent Results (from the past 240 hour(s))  Blood Culture (routine x 2)     Status: None (Preliminary result)   Collection Time: 12/23/15  6:10 PM  Result Value Ref Range Status   Specimen Description BLOOD RIGHT ANTECUBITAL  Final   Special Requests BOTTLES DRAWN AEROBIC ONLY 5CC  Final   Culture NO GROWTH < 24 HOURS  Final   Report Status PENDING  Incomplete  Blood Culture (routine x 2)     Status: None (Preliminary result)   Collection Time: 12/23/15  6:17 PM  Result Value Ref Range Status   Specimen Description BLOOD RIGHT HAND  Final   Special Requests BOTTLES DRAWN AEROBIC ONLY 5CC  Final   Culture NO GROWTH < 24 HOURS  Final   Report Status PENDING  Incomplete    Radiology Reports Dg Chest 2 View  12/23/2015  CLINICAL DATA:  Pt has a hx of stroke and dementia. Care giver reports that pt has not been acting her norm since she woke up. no cough or congestion. No fever. No n/v. No apparent pain or resp difficulty. EXAM: CHEST  2 VIEW COMPARISON:  None. FINDINGS: Cardiac silhouette is normal in size. No mediastinal or hilar masses or evidence of adenopathy. Lungs are clear.  No pleural effusion or pneumothorax. Bony thorax is demineralized but grossly intact. IMPRESSION: No acute cardiopulmonary disease. Electronically Signed   By: Amie Portlandavid  Ormond M.D.   On: 12/23/2015 18:03   Ct Head Wo Contrast  12/23/2015   CLINICAL DATA:  Altered mental status, borderline dementia EXAM: CT HEAD WITHOUT CONTRAST TECHNIQUE: Contiguous axial images were obtained from the base of the skull through the vertex without intravenous contrast. COMPARISON:  None.  FINDINGS: There is atrophy and chronic small vessel disease changes. No acute intracranial abnormality. Specifically, no hemorrhage, hydrocephalus, mass lesion, acute infarction, or significant intracranial injury. No acute calvarial abnormality. Visualized paranasal sinuses and mastoids clear. Orbital soft tissues unremarkable. IMPRESSION: No acute intracranial abnormality. Atrophy, chronic microvascular disease. Electronically Signed   By: Charlett Nose M.D.   On: 12/23/2015 16:58     CBC  Recent Labs Lab 12/23/15 1650 12/24/15 0418  WBC 13.3* 8.2  HGB 14.6 11.9*  HCT 43.5 35.4*  PLT 288 253  MCV 84.6 83.9  MCH 28.4 28.2  MCHC 33.6 33.6  RDW 14.6 14.7  LYMPHSABS 0.7  --   MONOABS 1.0  --   EOSABS 0.1  --   BASOSABS 0.0  --     Chemistries   Recent Labs Lab 12/23/15 1650 12/23/15 1810 12/24/15 0418  NA 140  --  139  K 4.6  --  4.0  CL 103  --  107  CO2 23  --  22  GLUCOSE 111*  --  123*  BUN 13  --  10  CREATININE 0.82  --  0.86  CALCIUM 9.5  --  8.0*  AST  --  43*  --   ALT  --  32  --   ALKPHOS  --  61  --   BILITOT  --  0.3  --    ------------------------------------------------------------------------------------------------------------------ No results for input(s): CHOL, HDL, LDLCALC, TRIG, CHOLHDL, LDLDIRECT in the last 72 hours.  No results found for: HGBA1C ------------------------------------------------------------------------------------------------------------------  Recent Labs  12/24/15 0419  TSH 0.790   ------------------------------------------------------------------------------------------------------------------ No results for input(s): VITAMINB12, FOLATE, FERRITIN, TIBC, IRON, RETICCTPCT in the last 72  hours.  Coagulation profile  Recent Labs Lab 12/24/15 0418  INR 1.96*    No results for input(s): DDIMER in the last 72 hours.  Cardiac Enzymes No results for input(s): CKMB, TROPONINI, MYOGLOBIN in the last 168 hours.  Invalid input(s): CK ------------------------------------------------------------------------------------------------------------------ No results found for: BNP  Time Spent in minutes  30   Vonzell Lindblad K M.D on 12/24/2015 at 12:12 PM  Between 7am to 7pm - Pager - [phone]  After 7pm go to [email or web address] - password [facility]  [facility] -  Office  [phone]

## 2015-12-25 NOTE — Progress Notes (Signed)
Utilization Review Completed.Sumiko Ceasar T3/02/2016  

## 2015-12-25 NOTE — Progress Notes (Signed)
PT Cancellation Note  Patient Details Name: Laura Bird MRN: [id number] DOB: 06/27/1925   Cancelled Treatment:    Reason Eval/Treat Not Completed: Fatigue/lethargy limiting ability to participate (pt maintaining eyes closed, would not follow commands or agree to participate at this time even with assist and cues of caregiver. Caregiver present to provide PLOF and states pt very fatigued. Will attempt next date)   Laura Bird, Laura Bird [name] 12/25/2015, 11:52 AM Laura Bird, PT [phone]

## 2015-12-25 NOTE — Progress Notes (Signed)
Patient Demographics:    Laura Bird, is a 82 y.o. female, DOB - 26-Sep-1925, ZOX:[id number]  Admit date - 12/23/2015   Admitting Physician Michael Litter, MD  Outpatient Primary MD for the patient is Pcp Not In System  LOS - 2   Chief Complaint  Patient presents with  . Altered Mental Status        Subjective:    Laura Bird today has, No headache, No chest pain, No abdominal pain - No Nausea, No new weakness tingling or numbness, No Cough - SOB.    Assessment  & Plan :     1.Sepsis upon admission due to influenza B infection. Chest x-ray clear, afebrile now, no leukocytosis, lactic acid has normalized after hydration, patient appears nontoxic, denies any headache, head CT nonacute, neck supple. Continue to monitor on Tamiflu. Stopped all antibiotics.Advance activity likely home discharge on 12/26/2015.  2. Advanced dementia. Supportive care, at risk for delirium, mid MIs narcotics and benzodiazepines.  3. Right lower extremity DVT in 2015. On xaralto continue. Defer long-term continuation to PCP.  4. Hypothyroidism. On Synthroid continue.  5. Dyslipidemia. Continue home dose statin unchanged.    Code Status : Full  Family Communication  : Daughter bedside  Disposition Plan  : Per daughter wants to take home on 12/26/2015 does not want placement  Consults  : None  Procedures  :   CT head and chest x-ray nonacute  DVT Prophylaxis  :  Xaralto  Lab Results  Component Value Date   PLT 253 12/24/2015    Inpatient Medications  Scheduled Meds: . chlorhexidine  5 mL Mouth/Throat BID  . levothyroxine  112 mcg Oral Once per day on Mon Wed Fri Sat  . loratadine  10 mg Oral Daily  . LORazepam  0.5-1 mg Oral QID  . Melatonin  3 mg Oral QHS  . oseltamivir  30 mg Oral BID  .  polyethylene glycol  17 g Oral Daily  . rivaroxaban  20 mg Oral Q lunch  . sertraline  75 mg Oral q1800  . simvastatin  20 mg Oral q1800  . sodium chloride flush  3 mL Intravenous Q12H   Continuous Infusions: . sodium chloride 75 mL/hr at 12/25/15 0551   PRN Meds:.acetaminophen **OR** acetaminophen  Antibiotics  :     Anti-infectives    Start     Dose/Rate Route Frequency Ordered Stop   12/24/15 0900  oseltamivir (TAMIFLU) capsule 30 mg    Comments:  Tamiflu 30 mg po BID for CrCl < 60 mL/min   30 mg Oral 2 times daily 12/24/15 0747 12/29/15 0959   12/24/15 0400  aztreonam (AZACTAM) 1 g in dextrose 5 % 50 mL IVPB  Status:  Discontinued     1 g 100 mL/hr over 30 Minutes Intravenous 3 times per day 12/24/15 0252 12/24/15 1210   12/23/15 1830  vancomycin (VANCOCIN) IVPB 1000 mg/200 mL premix  Status:  Discontinued     1,000 mg 200 mL/hr over 60 Minutes Intravenous Every 24 hours 12/23/15 1819 12/24/15 1210   12/23/15 1830  levofloxacin (LEVAQUIN) IVPB 750 mg  Status:  Discontinued     750 mg 100 mL/hr over 90 Minutes Intravenous Every 48 hours 12/23/15 1819 12/24/15 1210  12/23/15 1815  levofloxacin (LEVAQUIN) IVPB 750 mg  Status:  Discontinued     750 mg 100 mL/hr over 90 Minutes Intravenous  Once 12/23/15 1804 12/23/15 1820   12/23/15 1815  aztreonam (AZACTAM) 2 g in dextrose 5 % 50 mL IVPB     2 g 100 mL/hr over 30 Minutes Intravenous  Once 12/23/15 1804 12/23/15 2111   12/23/15 1815  vancomycin (VANCOCIN) IVPB 1000 mg/200 mL premix  Status:  Discontinued     1,000 mg 200 mL/hr over 60 Minutes Intravenous  Once 12/23/15 1804 12/23/15 1819        Objective:   Filed Vitals:   12/24/15 1040 12/24/15 1443 12/24/15 2136 12/25/15 0615  BP: 117/59 137/52 142/69 127/60  Pulse: 86 87 89 82  Temp: 98.6 F (37 C) 98.5 F (36.9 C) 98.6 F (37 C) 98.9 F (37.2 C)  TempSrc: Axillary Oral  Oral  Resp: 20 18 18 16   Height: 5\' 2"  (1.575 m)     Weight: 77.747 kg (171 lb 6.4 oz)      SpO2: 95% 96% 94% 94%    Wt Readings from Last 3 Encounters:  12/24/15 77.747 kg (171 lb 6.4 oz)     Intake/Output Summary (Last 24 hours) at 12/25/15 1017 Last data filed at 12/24/15 1900  Gross per 24 hour  Intake  552.5 ml  Output      0 ml  Net  552.5 ml     Physical Exam  Awake but confused, No new F.N deficits, Normal affect [place].AT,PERRAL Supple Neck,No JVD, No cervical lymphadenopathy appriciated.  Symmetrical Chest wall movement, Good air movement bilaterally, CTAB RRR,No Gallops,Rubs or new Murmurs, No Parasternal Heave +ve B.Sounds, Abd Soft, No tenderness, No organomegaly appriciated, No rebound - guarding or rigidity. No Cyanosis, Clubbing or edema, No new Rash or bruise     Data Review:   Micro Results Recent Results (from the past 240 hour(s))  Blood Culture (routine x 2)     Status: None (Preliminary result)   Collection Time: 12/23/15  6:10 PM  Result Value Ref Range Status   Specimen Description BLOOD RIGHT ANTECUBITAL  Final   Special Requests BOTTLES DRAWN AEROBIC ONLY 5CC  Final   Culture NO GROWTH < 24 HOURS  Final   Report Status PENDING  Incomplete  Urine culture     Status: None (Preliminary result)   Collection Time: 12/23/15  6:17 PM  Result Value Ref Range Status   Specimen Description URINE, CATHETERIZED  Final   Special Requests NONE  Final   Culture CULTURE REINCUBATED FOR BETTER GROWTH  Final   Report Status PENDING  Incomplete  Blood Culture (routine x 2)     Status: None (Preliminary result)   Collection Time: 12/23/15  6:17 PM  Result Value Ref Range Status   Specimen Description BLOOD RIGHT HAND  Final   Special Requests BOTTLES DRAWN AEROBIC ONLY 5CC  Final   Culture NO GROWTH < 24 HOURS  Final   Report Status PENDING  Incomplete    Radiology Reports Dg Chest 2 View  12/23/2015  CLINICAL DATA:  Pt has a hx of stroke and dementia. Care giver reports that pt has not been acting her norm since she woke up. no cough or  congestion. No fever. No n/v. No apparent pain or resp difficulty. EXAM: CHEST  2 VIEW COMPARISON:  None. FINDINGS: Cardiac silhouette is normal in size. No mediastinal or hilar masses or evidence of adenopathy. Lungs are clear.  No pleural effusion or pneumothorax. Bony thorax is demineralized but grossly intact. IMPRESSION: No acute cardiopulmonary disease. Electronically Signed   By: Amie Portland M.D.   On: 12/23/2015 18:03   Ct Head Wo Contrast  12/23/2015  CLINICAL DATA:  Altered mental status, borderline dementia EXAM: CT HEAD WITHOUT CONTRAST TECHNIQUE: Contiguous axial images were obtained from the base of the skull through the vertex without intravenous contrast. COMPARISON:  None. FINDINGS: There is atrophy and chronic small vessel disease changes. No acute intracranial abnormality. Specifically, no hemorrhage, hydrocephalus, mass lesion, acute infarction, or significant intracranial injury. No acute calvarial abnormality. Visualized paranasal sinuses and mastoids clear. Orbital soft tissues unremarkable. IMPRESSION: No acute intracranial abnormality. Atrophy, chronic microvascular disease. Electronically Signed   By: Charlett Nose M.D.   On: 12/23/2015 16:58     CBC  Recent Labs Lab 12/23/15 1650 12/24/15 0418  WBC 13.3* 8.2  HGB 14.6 11.9*  HCT 43.5 35.4*  PLT 288 253  MCV 84.6 83.9  MCH 28.4 28.2  MCHC 33.6 33.6  RDW 14.6 14.7  LYMPHSABS 0.7  --   MONOABS 1.0  --   EOSABS 0.1  --   BASOSABS 0.0  --     Chemistries   Recent Labs Lab 12/23/15 1650 12/23/15 1810 12/24/15 0418  NA 140  --  139  K 4.6  --  4.0  CL 103  --  107  CO2 23  --  22  GLUCOSE 111*  --  123*  BUN 13  --  10  CREATININE 0.82  --  0.86  CALCIUM 9.5  --  8.0*  AST  --  43*  --   ALT  --  32  --   ALKPHOS  --  61  --   BILITOT  --  0.3  --    ------------------------------------------------------------------------------------------------------------------ No results for input(s): CHOL, HDL,  LDLCALC, TRIG, CHOLHDL, LDLDIRECT in the last 72 hours.  No results found for: HGBA1C ------------------------------------------------------------------------------------------------------------------  Recent Labs  12/24/15 0419  TSH 0.790   ------------------------------------------------------------------------------------------------------------------ No results for input(s): VITAMINB12, FOLATE, FERRITIN, TIBC, IRON, RETICCTPCT in the last 72 hours.  Coagulation profile  Recent Labs Lab 12/24/15 0418  INR 1.96*    No results for input(s): DDIMER in the last 72 hours.  Cardiac Enzymes No results for input(s): CKMB, TROPONINI, MYOGLOBIN in the last 168 hours.  Invalid input(s): CK ------------------------------------------------------------------------------------------------------------------ No results found for: BNP  Time Spent in minutes  30   Randy Castrejon K M.D on 12/25/2015 at 10:17 AM  Between 7am to 7pm - Pager - [phone]  After 7pm go to [email or web address] - password [facility]  [facility] -  Office  [phone]

## 2015-12-26 ENCOUNTER — Encounter (HOSPITAL_COMMUNITY): Payer: Self-pay | Admitting: *Deleted

## 2015-12-26 DIAGNOSIS — J189 Pneumonia, unspecified organism: Secondary | ICD-10-CM

## 2015-12-26 MED ORDER — CEFPODOXIME PROXETIL 200 MG PO TABS
200.0000 mg | ORAL_TABLET | Freq: Two times a day (BID) | ORAL | Status: DC
  Filled 2015-12-26: qty 1

## 2015-12-26 MED ORDER — OSELTAMIVIR PHOSPHATE 30 MG PO CAPS: 30.0000 mg | ORAL_CAPSULE | Freq: Two times a day (BID) | ORAL | Status: DC

## 2015-12-26 MED ORDER — DEXTROMETHORPHAN-GUAIFENESIN 10-100 MG/5ML PO LIQD: 10.0000 mL | ORAL | Status: DC | PRN

## 2015-12-26 MED ORDER — CEFPODOXIME PROXETIL 200 MG PO TABS: 200.0000 mg | ORAL_TABLET | Freq: Two times a day (BID) | ORAL | Status: DC

## 2015-12-26 NOTE — Care Management Important Message (Signed)
Important Message  Patient Details  Name: Laura Bird MRN: [id number] Date of Birth: 05/12/1925   Medicare Important Message Given:  Yes    Lawerance Sabalebbie Carrine Kroboth, RN 12/26/2015, 11:59 AMImportant Message  Patient Details  Name: Laura Bird MRN: [id number] Date of Birth: 08/14/1925   Medicare Important Message Given:  Yes    Lawerance Sabalebbie Jamarious Febo, RN 12/26/2015, 11:59 AM

## 2015-12-26 NOTE — Progress Notes (Signed)
Daughter (POA) requested that IV not be restarted after initial IV site was leaking and had to be removed.  Pt may be discharged today.  Risks associated with not having an IV line placed were discussed with patient's daughter.  She insisted that it be removed, but not replaced.  Will let am nurse know, and will continue to monitor patient.

## 2015-12-26 NOTE — Progress Notes (Signed)
Nsg Discharge Note  Admit Date:  12/23/2015 Discharge date: 12/26/2015   Laura Bird to be D/C'd Home with family and 24 hour caregiver per MD order.  AVS completed.  Copy for chart, and copy for patient signed, and dated. Patient/caregiver able to verbalize understanding.  Discharge Medication:   Medication List    TAKE these medications        cefpodoxime 200 MG tablet  Commonly known as:  VANTIN  Take 1 tablet (200 mg total) by mouth every 12 (twelve) hours.     cetirizine 10 MG tablet  Commonly known as:  ZYRTEC  Take 10 mg by mouth daily.     chlorhexidine 0.12 % solution  Commonly known as:  PERIDEX  Use as directed 5 mLs in the mouth or throat 2 (two) times daily.     cholecalciferol 1000 units tablet  Commonly known as:  VITAMIN D  Take 1,000 Units by mouth daily.     dextromethorphan-guaiFENesin 10-100 MG/5ML liquid  Commonly known as:  TUSSIN DM  Take 10 mLs by mouth every 4 (four) hours as needed for cough.     Fish Oil 1200 MG Caps  Take 1,200 mg by mouth daily.     levothyroxine 112 MCG tablet  Commonly known as:  SYNTHROID, LEVOTHROID  Take 112 mcg by mouth daily before breakfast. Monday, Wednesday, Friday and Saturday.     LORazepam 0.5 MG tablet  Commonly known as:  ATIVAN  Take 0.5-1 mg by mouth 4 (four) times daily. Take 0.5 mg in the morning, Take 1 mg at noon. Take 0.5 in the afternoon and evening.     Melatonin 3 MG Caps  Take 3 mg by mouth at bedtime.     oseltamivir 30 MG capsule  Commonly known as:  TAMIFLU  Take 1 capsule (30 mg total) by mouth 2 (two) times daily.     polyethylene glycol packet  Commonly known as:  MIRALAX / GLYCOLAX  Take 17 g by mouth daily.     sertraline 50 MG tablet  Commonly known as:  ZOLOFT  Take 75 mg by mouth daily at 6 PM.     simvastatin 20 MG tablet  Commonly known as:  ZOCOR  Take 20 mg by mouth daily at 6 PM.     vitamin E 400 UNIT capsule  Take 400 Units by mouth daily.     XARELTO 20 MG Tabs  tablet  Generic drug:  rivaroxaban  Take 20 mg by mouth daily at 12 noon.        Discharge Assessment: Filed Vitals:   12/25/15 2212 12/26/15 0615  BP: 159/76 118/52  Pulse: 88 76  Temp: 98.7 F (37.1 C) 98.4 F (36.9 C)  Resp: 18 16   Skin clean, dry and intact without evidence of skin break down, no evidence of skin tears noted. IV catheter discontinued intact. Site without signs and symptoms of complications - no redness or edema noted at insertion site, patient denies c/o pain - only slight tenderness at site.  Dressing with slight pressure applied.  D/c Instructions-Education: Discharge instructions given to patient/family with verbalized understanding. D/c education completed with patient/family including follow up instructions, medication list, d/c activities limitations if indicated, with other d/c instructions as indicated by MD - patient able to verbalize understanding, all questions fully answered. Patient instructed to return to ED, call 911, or call MD for any changes in condition.  Patient escorted via WC, and D/C home via private auto.  Chip Boer  Maggie SchwalbeL Kashayla Ungerer, RN 12/26/2015 12:46 PM

## 2015-12-26 NOTE — Discharge Instructions (Signed)
Follow with Primary MD in 3-4 days   Get CBC, CMP, 2 view Chest X ray checked  by Primary MD next visit.    Activity: As tolerated with Full fall precautions use walker/cane & assistance as needed   Disposition Home     Diet:   Heart Healthy  with feeding assistance and aspiration precautions.  For Heart failure patients - Check your Weight same time everyday, if you gain over 2 pounds, or you develop in leg swelling, experience more shortness of breath or chest pain, call your Primary MD immediately. Follow Cardiac Low Salt Diet and 1.5 lit/day fluid restriction.   On your next visit with your primary care physician please Get Medicines reviewed and adjusted.   Please request your Prim.MD to go over all Hospital Tests and Procedure/Radiological results at the follow up, please get all Hospital records sent to your Prim MD by signing hospital release before you go home.   If you experience worsening of your admission symptoms, develop shortness of breath, life threatening emergency, suicidal or homicidal thoughts you must seek medical attention immediately by calling 911 or calling your MD immediately  if symptoms less severe.  You Must read complete instructions/literature along with all the possible adverse reactions/side effects for all the Medicines you take and that have been prescribed to you. Take any new Medicines after you have completely understood and accpet all the possible adverse reactions/side effects.   Do not drive, operating heavy machinery, perform activities at heights, swimming or participation in water activities or provide baby sitting services if your were admitted for syncope or siezures until you have seen by Primary MD or a Neurologist and advised to do so again.  Do not drive when taking Pain medications.    Do not take more than prescribed Pain, Sleep and Anxiety Medications  Special Instructions: If you have smoked or chewed Tobacco  in the last 2 yrs  please stop smoking, stop any regular Alcohol  and or any Recreational drug use.  Wear Seat belts while driving.   Please note  You were cared for by a hospitalist during your hospital stay. If you have any questions about your discharge medications or the care you received while you were in the hospital after you are discharged, you can call the unit and asked to speak with the hospitalist on call if the hospitalist that took care of you is not available. Once you are discharged, your primary care physician will handle any further medical issues. Please note that NO REFILLS for any discharge medications will be authorized once you are discharged, as it is imperative that you return to your primary care physician (or establish a relationship with a primary care physician if you do not have one) for your aftercare needs so that they can reassess your need for medications and monitor your lab values.

## 2015-12-26 NOTE — Discharge Summary (Signed)
Laura Bird, is a 82 y.o. female  DOB 08/17/1925  MRN [id number].  Admission date:  12/23/2015  Admitting Physician  Michael LitterNikki Carter, MD  Discharge Date:  12/26/2015   Primary MD  Pcp Not In System  Recommendations for primary care physician for things to follow:   Check CBC, BMP and a 2 view chest x-ray next visit   Admission Diagnosis  Lactic acidosis [E87.2] Sepsis, due to unspecified organism [facility](HCC) [A41.9]   Discharge Diagnosis  Lactic acidosis [E87.2] Sepsis, due to unspecified organism [facility](HCC) [A41.9]     Principal Problem:   Sepsis (HCC) Active Problems:   Lactic acidosis   Dementia   Hypothyroidism   DVT, lower extremity (HCC)   Pneumonia      Past Medical History  Diagnosis Date  . Arthritis   . Dementia   . Stroke (HCC)   . Constipation   . Thrombophlebitis   . Thyroid disease     hyperpara  . DJD (degenerative joint disease)     Past Surgical History  Procedure Laterality Date  . Abdominal hysterectomy         HPI  from the history and physical done on the day of admission:   Laura Bird is a 82 y.o. woman with a history of CVA/TIA, vascular dementia, RLE DVT in 2015, and arthritis who continues to live in her home with 24-hour caregivers. She presents to the ED accompanied by her daughter and one of her caregivers for evaluation of mental status changes and weakness. At baseline, she spends most days on the couch or in a wheelchair. She does not ambulate independently anymore. She is not oriented to time but recognizes family, engages in social conversation, and is able to follow commands. This afternoon, she demonstrated increased nasal congestion and new cough. She became less responsive and seemed weaker, with decreased use of her right arm. She would not follow commands. The  caregiver notified the patient's daughter, and the patient was ultimately brought to the hospital by EMS personnel. Since being in the ED, she has spiked a fever to 102. She has a leukocytosis and elevated lactic acid level. Hospitalist asked to admit for further evaluation and management of sepsis.      Hospital Course:   1.Sepsis upon admission due to influenza B infection. Chest x-ray clear, afebrile now, no leukocytosis, lactic acid has normalized after hydration, patient appears nontoxic, denies any headache, head CT nonacute, neck supple. Placed on Tamiflu. She feels much better, home discharge as requested by family. She is likely close to her baseline. Request PCP to check CBC, BMP and a 2 view chest x-ray next visit.  2. Advanced dementia. Supportive care, at risk for delirium, mid MIs narcotics and benzodiazepines.  3. Right lower extremity DVT in 2015. On xaralto continue. Defer long-term continuation to PCP.  4. Hypothyroidism. On Synthroid continue.  5. Dyslipidemia. Continue home dose statin unchanged.   6. UTI. She had some odor in her urine, UA itself was not very impressive however culture growing  over 100,000 colonies of Escherichia coli, place on Vantin or 3 days.    Discharge Condition: fair  Follow UP  Follow-up Information    Follow up with PCP . Schedule an appointment as soon as possible for a visit in 3 days.       Consults obtained - None  Diet and Activity recommendation: See Discharge Instructions below  Discharge Instructions       Discharge Instructions    Discharge instructions    Complete by:  As directed   Follow with Primary MD in 3-4 days   Get CBC, CMP, 2 view Chest X ray checked  by Primary MD next visit.    Activity: As tolerated with Full fall precautions use walker/cane & assistance as needed   Disposition Home     Diet:   Heart Healthy  with feeding assistance and aspiration precautions.  For Heart failure patients - Check  your Weight same time everyday, if you gain over 2 pounds, or you develop in leg swelling, experience more shortness of breath or chest pain, call your Primary MD immediately. Follow Cardiac Low Salt Diet and 1.5 lit/day fluid restriction.   On your next visit with your primary care physician please Get Medicines reviewed and adjusted.   Please request your Prim.MD to go over all Hospital Tests and Procedure/Radiological results at the follow up, please get all Hospital records sent to your Prim MD by signing hospital release before you go home.   If you experience worsening of your admission symptoms, develop shortness of breath, life threatening emergency, suicidal or homicidal thoughts you must seek medical attention immediately by calling 911 or calling your MD immediately  if symptoms less severe.  You Must read complete instructions/literature along with all the possible adverse reactions/side effects for all the Medicines you take and that have been prescribed to you. Take any new Medicines after you have completely understood and accpet all the possible adverse reactions/side effects.   Do not drive, operating heavy machinery, perform activities at heights, swimming or participation in water activities or provide baby sitting services if your were admitted for syncope or siezures until you have seen by Primary MD or a Neurologist and advised to do so again.  Do not drive when taking Pain medications.    Do not take more than prescribed Pain, Sleep and Anxiety Medications  Special Instructions: If you have smoked or chewed Tobacco  in the last 2 yrs please stop smoking, stop any regular Alcohol  and or any Recreational drug use.  Wear Seat belts while driving.   Please note  You were cared for by a hospitalist during your hospital stay. If you have any questions about your discharge medications or the care you received while you were in the hospital after you are discharged, you can  call the unit and asked to speak with the hospitalist on call if the hospitalist that took care of you is not available. Once you are discharged, your primary care physician will handle any further medical issues. Please note that NO REFILLS for any discharge medications will be authorized once you are discharged, as it is imperative that you return to your primary care physician (or establish a relationship with a primary care physician if you do not have one) for your aftercare needs so that they can reassess your need for medications and monitor your lab values.     Increase activity slowly    Complete by:  As directed  Discharge Medications       Medication List    TAKE these medications        cefpodoxime 200 MG tablet  Commonly known as:  VANTIN  Take 1 tablet (200 mg total) by mouth every 12 (twelve) hours.     cetirizine 10 MG tablet  Commonly known as:  ZYRTEC  Take 10 mg by mouth daily.     chlorhexidine 0.12 % solution  Commonly known as:  PERIDEX  Use as directed 5 mLs in the mouth or throat 2 (two) times daily.     cholecalciferol 1000 units tablet  Commonly known as:  VITAMIN D  Take 1,000 Units by mouth daily.     dextromethorphan-guaiFENesin 10-100 MG/5ML liquid  Commonly known as:  TUSSIN DM  Take 10 mLs by mouth every 4 (four) hours as needed for cough.     Fish Oil 1200 MG Caps  Take 1,200 mg by mouth daily.     levothyroxine 112 MCG tablet  Commonly known as:  SYNTHROID, LEVOTHROID  Take 112 mcg by mouth daily before breakfast. Monday, Wednesday, Friday and Saturday.     LORazepam 0.5 MG tablet  Commonly known as:  ATIVAN  Take 0.5-1 mg by mouth 4 (four) times daily. Take 0.5 mg in the morning, Take 1 mg at noon. Take 0.5 in the afternoon and evening.     Melatonin 3 MG Caps  Take 3 mg by mouth at bedtime.     oseltamivir 30 MG capsule  Commonly known as:  TAMIFLU  Take 1 capsule (30 mg total) by mouth 2 (two) times daily.      polyethylene glycol packet  Commonly known as:  MIRALAX / GLYCOLAX  Take 17 g by mouth daily.     sertraline 50 MG tablet  Commonly known as:  ZOLOFT  Take 75 mg by mouth daily at 6 PM.     simvastatin 20 MG tablet  Commonly known as:  ZOCOR  Take 20 mg by mouth daily at 6 PM.     vitamin E 400 UNIT capsule  Take 400 Units by mouth daily.     XARELTO 20 MG Tabs tablet  Generic drug:  rivaroxaban  Take 20 mg by mouth daily at 12 noon.        Major procedures and Radiology Reports - PLEASE review detailed and final reports for all details, in brief -       Dg Chest 2 View  12/23/2015  CLINICAL DATA:  Pt has a hx of stroke and dementia. Care giver reports that pt has not been acting her norm since she woke up. no cough or congestion. No fever. No n/v. No apparent pain or resp difficulty. EXAM: CHEST  2 VIEW COMPARISON:  None. FINDINGS: Cardiac silhouette is normal in size. No mediastinal or hilar masses or evidence of adenopathy. Lungs are clear.  No pleural effusion or pneumothorax. Bony thorax is demineralized but grossly intact. IMPRESSION: No acute cardiopulmonary disease. Electronically Signed   By: Amie Portland M.D.   On: 12/23/2015 18:03   Ct Head Wo Contrast  12/23/2015  CLINICAL DATA:  Altered mental status, borderline dementia EXAM: CT HEAD WITHOUT CONTRAST TECHNIQUE: Contiguous axial images were obtained from the base of the skull through the vertex without intravenous contrast. COMPARISON:  None. FINDINGS: There is atrophy and chronic small vessel disease changes. No acute intracranial abnormality. Specifically, no hemorrhage, hydrocephalus, mass lesion, acute infarction, or significant intracranial injury. No acute calvarial abnormality. Visualized paranasal  sinuses and mastoids clear. Orbital soft tissues unremarkable. IMPRESSION: No acute intracranial abnormality. Atrophy, chronic microvascular disease. Electronically Signed   By: Charlett Nose M.D.   On: 12/23/2015 16:58     Micro Results      Recent Results (from the past 240 hour(s))  Blood Culture (routine x 2)     Status: None (Preliminary result)   Collection Time: 12/23/15  6:10 PM  Result Value Ref Range Status   Specimen Description BLOOD RIGHT ANTECUBITAL  Final   Special Requests BOTTLES DRAWN AEROBIC ONLY 5CC  Final   Culture NO GROWTH 2 DAYS  Final   Report Status PENDING  Incomplete  Urine culture     Status: None (Preliminary result)   Collection Time: 12/23/15  6:17 PM  Result Value Ref Range Status   Specimen Description URINE, CATHETERIZED  Final   Special Requests NONE  Final   Culture   Final    >=100,000 COLONIES/mL ESCHERICHIA COLI SUSCEPTIBILITIES TO FOLLOW    Report Status PENDING  Incomplete  Blood Culture (routine x 2)     Status: None (Preliminary result)   Collection Time: 12/23/15  6:17 PM  Result Value Ref Range Status   Specimen Description BLOOD RIGHT HAND  Final   Special Requests BOTTLES DRAWN AEROBIC ONLY 5CC  Final   Culture NO GROWTH 2 DAYS  Final   Report Status PENDING  Incomplete       Today   Subjective    Laura Bird today has no headache,no chest abdominal pain,no new weakness tingling or numbness, feels much better wants to go home today.     Objective   Blood pressure 118/52, pulse 76, temperature 98.4 F (36.9 C), temperature source Oral, resp. rate 16, height  (1.575 m), weight 77.747 kg (171 lb 6.4 oz), SpO2 93 %.   Intake/Output Summary (Last 24 hours) at 12/26/15 1123 Last data filed at 12/25/15 1352  Gross per 24 hour  Intake    240 ml  Output      0 ml  Net    240 ml    Exam Awake, pleasantly confused, No new F.N deficits, Normal affect [place].AT,PERRAL Supple Neck,No JVD, No cervical lymphadenopathy appriciated.  Symmetrical Chest wall movement, Good air movement bilaterally, CTAB RRR,No Gallops,Rubs or new Murmurs, No Parasternal Heave +ve B.Sounds, Abd Soft, Non tender, No organomegaly appriciated, No rebound  -guarding or rigidity. No Cyanosis, Clubbing or edema, No new Rash or bruise   Data Review   CBC w Diff: Lab Results  Component Value Date   WBC 8.2 12/24/2015   HGB 11.9* 12/24/2015   HCT 35.4* 12/24/2015   PLT 253 12/24/2015   LYMPHOPCT 6 12/23/2015   MONOPCT 7 12/23/2015   EOSPCT 1 12/23/2015   BASOPCT 0 12/23/2015    CMP: Lab Results  Component Value Date   NA 139 12/24/2015   K 4.0 12/24/2015   CL 107 12/24/2015   CO2 22 12/24/2015   BUN 10 12/24/2015   CREATININE 0.86 12/24/2015   PROT 6.7 12/23/2015   ALBUMIN 3.8 12/23/2015   BILITOT 0.3 12/23/2015   ALKPHOS 61 12/23/2015   AST 43* 12/23/2015   ALT 32 12/23/2015  .   Total Time in preparing paper work, data evaluation and todays exam - 35 minutes  Leroy Sea M.D on 12/26/2015 at 11:23 AM  [facility]   Office  [phone]

## 2015-12-26 NOTE — Care Management Note (Signed)
Case Management Note  Patient Details  Name: Laura Bird MRN: [id number] Date of Birth: 10/01/1925  Subjective/Objective:                 Spoke with patient's daughter Erskine SquibbJane. She states that they have 4 CNA's that provide 24/7 care, and they require no additional DME. She states that she arranged to have [facility] transportation set up to take patient home. She denies any assistance from CM.   Action/Plan:  DC to home today in care of family and private duty sitters.  Expected Discharge Date:                  Expected Discharge Plan:  Home/Self Care  In-House Referral:     Discharge planning Services  CM Consult  Post Acute Care Choice:  NA Choice offered to:  Adult Children  DME Arranged:    DME Agency:     HH Arranged:    HH Agency:     Status of Service:  Completed, signed off  Medicare Important Message Given:    Date Medicare IM Given:    Medicare IM give by:    Date Additional Medicare IM Given:    Additional Medicare Important Message give by:     If discussed at Long Length of Stay Meetings, dates discussed:    Additional Comments:  Lawerance SabalDebbie Verina Galeno, RN 12/26/2015, 11:31 AM

## 2015-12-28 LAB — CULTURE, BLOOD (ROUTINE X 2)
Culture: NO GROWTH
Culture: NO GROWTH

## 2015-12-28 LAB — URINE CULTURE: Culture: >=100000

## 2016-09-12 ENCOUNTER — Emergency Department (HOSPITAL_COMMUNITY): Payer: Medicare Other

## 2016-09-12 ENCOUNTER — Inpatient Hospital Stay (HOSPITAL_COMMUNITY)
Admission: EM | Admit: 2016-09-12 | Discharge: 2016-09-15 | DRG: 871 | Disposition: A | Discharge status: Home / Self Care | Payer: Medicare Other | Attending: Internal Medicine | Admitting: Internal Medicine

## 2016-09-12 ENCOUNTER — Encounter (HOSPITAL_COMMUNITY): Payer: Self-pay | Admitting: Internal Medicine

## 2016-09-12 DIAGNOSIS — B9620 Unspecified Escherichia coli [E. coli] as the cause of diseases classified elsewhere: Secondary | ICD-10-CM | POA: Diagnosis present

## 2016-09-12 DIAGNOSIS — R627 Adult failure to thrive: Secondary | ICD-10-CM | POA: Diagnosis present

## 2016-09-12 DIAGNOSIS — E039 Hypothyroidism, unspecified: Secondary | ICD-10-CM | POA: Diagnosis present

## 2016-09-12 DIAGNOSIS — A4151 Sepsis due to Escherichia coli [E. coli]: Secondary | ICD-10-CM

## 2016-09-12 DIAGNOSIS — Z993 Dependence on wheelchair: Secondary | ICD-10-CM

## 2016-09-12 DIAGNOSIS — F0150 Vascular dementia without behavioral disturbance: Secondary | ICD-10-CM

## 2016-09-12 DIAGNOSIS — G9341 Metabolic encephalopathy: Secondary | ICD-10-CM | POA: Diagnosis present

## 2016-09-12 DIAGNOSIS — Z880 Allergy status to penicillin: Secondary | ICD-10-CM

## 2016-09-12 DIAGNOSIS — R4182 Altered mental status, unspecified: Secondary | ICD-10-CM | POA: Diagnosis present

## 2016-09-12 DIAGNOSIS — Z7901 Long term (current) use of anticoagulants: Secondary | ICD-10-CM | POA: Diagnosis not present

## 2016-09-12 DIAGNOSIS — N3000 Acute cystitis without hematuria: Secondary | ICD-10-CM

## 2016-09-12 DIAGNOSIS — Z885 Allergy status to narcotic agent status: Secondary | ICD-10-CM | POA: Diagnosis not present

## 2016-09-12 DIAGNOSIS — F0390 Unspecified dementia without behavioral disturbance: Secondary | ICD-10-CM | POA: Diagnosis present

## 2016-09-12 DIAGNOSIS — I82509 Chronic embolism and thrombosis of unspecified deep veins of unspecified lower extremity: Secondary | ICD-10-CM | POA: Diagnosis not present

## 2016-09-12 DIAGNOSIS — E038 Other specified hypothyroidism: Secondary | ICD-10-CM | POA: Diagnosis not present

## 2016-09-12 DIAGNOSIS — F0151 Vascular dementia with behavioral disturbance: Secondary | ICD-10-CM

## 2016-09-12 DIAGNOSIS — N390 Urinary tract infection, site not specified: Secondary | ICD-10-CM | POA: Diagnosis present

## 2016-09-12 DIAGNOSIS — Z86718 Personal history of other venous thrombosis and embolism: Secondary | ICD-10-CM

## 2016-09-12 DIAGNOSIS — A419 Sepsis, unspecified organism: Principal | ICD-10-CM | POA: Diagnosis present

## 2016-09-12 DIAGNOSIS — Z8673 Personal history of transient ischemic attack (TIA), and cerebral infarction without residual deficits: Secondary | ICD-10-CM

## 2016-09-12 DIAGNOSIS — I82409 Acute embolism and thrombosis of unspecified deep veins of unspecified lower extremity: Secondary | ICD-10-CM | POA: Diagnosis present

## 2016-09-12 HISTORY — DX: Acute embolism and thrombosis of unspecified deep veins of unspecified lower extremity: I82.409

## 2016-09-12 LAB — CBC WITH DIFFERENTIAL/PLATELET
Basophils Absolute: 0 10*3/uL (ref 0.0–0.1)
Basophils Relative: 0 %
EOS ABS: 0 10*3/uL (ref 0.0–0.7)
EOS PCT: 0 %
HCT: 40.3 % (ref 36.0–46.0)
HEMOGLOBIN: 13.7 g/dL (ref 12.0–15.0)
LYMPHS ABS: 2.1 10*3/uL (ref 0.7–4.0)
LYMPHS PCT: 13 %
MCH: 28.9 pg (ref 26.0–34.0)
MCHC: 34 g/dL (ref 30.0–36.0)
MCV: 85 fL (ref 78.0–100.0)
MONOS PCT: 7 %
Monocytes Absolute: 1.1 10*3/uL — ABNORMAL HIGH (ref 0.1–1.0)
Neutro Abs: 12.8 10*3/uL — ABNORMAL HIGH (ref 1.7–7.7)
Neutrophils Relative %: 80 %
PLATELETS: 309 10*3/uL (ref 150–400)
RBC: 4.74 MIL/uL (ref 3.87–5.11)
RDW: 14.1 % (ref 11.5–15.5)
WBC: 16 10*3/uL — ABNORMAL HIGH (ref 4.0–10.5)

## 2016-09-12 LAB — COMPREHENSIVE METABOLIC PANEL
ALBUMIN: 4 g/dL (ref 3.5–5.0)
ALT: 14 U/L (ref 14–54)
AST: 20 U/L (ref 15–41)
Alkaline Phosphatase: 53 U/L (ref 38–126)
Anion gap: 9 (ref 5–15)
BUN: 15 mg/dL (ref 6–20)
CHLORIDE: 106 mmol/L (ref 101–111)
CO2: 25 mmol/L (ref 22–32)
CREATININE: 0.96 mg/dL (ref 0.44–1.00)
Calcium: 9.1 mg/dL (ref 8.9–10.3)
GFR calc Af Amer: 59 mL/min — ABNORMAL LOW (ref >60)
GFR calc non Af Amer: 51 mL/min — ABNORMAL LOW (ref >60)
Glucose, Bld: 136 mg/dL — ABNORMAL HIGH (ref 65–99)
Potassium: 4.2 mmol/L (ref 3.5–5.1)
SODIUM: 140 mmol/L (ref 135–145)
Total Bilirubin: 0.8 mg/dL (ref 0.3–1.2)
Total Protein: 7.3 g/dL (ref 6.5–8.1)

## 2016-09-12 LAB — I-STAT CHEM 8, ED
BUN: 14 mg/dL (ref 6–20)
CHLORIDE: 103 mmol/L (ref 101–111)
CREATININE: 0.9 mg/dL (ref 0.44–1.00)
Calcium, Ion: 1.13 mmol/L — ABNORMAL LOW (ref 1.15–1.40)
Glucose, Bld: 135 mg/dL — ABNORMAL HIGH (ref 65–99)
HEMATOCRIT: 41 % (ref 36.0–46.0)
Hemoglobin: 13.9 g/dL (ref 12.0–15.0)
Potassium: 4.2 mmol/L (ref 3.5–5.1)
SODIUM: 140 mmol/L (ref 135–145)
TCO2: 25 mmol/L (ref 0–100)

## 2016-09-12 LAB — I-STAT TROPONIN, ED: Troponin i, poc: 0 ng/mL (ref 0.00–0.08)

## 2016-09-12 LAB — URINALYSIS, ROUTINE W REFLEX MICROSCOPIC
BILIRUBIN URINE: NEGATIVE
GLUCOSE, UA: NEGATIVE mg/dL
Ketones, ur: NEGATIVE mg/dL
Nitrite: POSITIVE — AB
Protein, ur: 30 mg/dL — AB
SPECIFIC GRAVITY, URINE: 1.021 (ref 1.005–1.030)
pH: 7.5 (ref 5.0–8.0)

## 2016-09-12 LAB — RAPID URINE DRUG SCREEN, HOSP PERFORMED
Amphetamines: NOT DETECTED
BARBITURATES: NOT DETECTED
BENZODIAZEPINES: POSITIVE — AB
COCAINE: NOT DETECTED
OPIATES: NOT DETECTED
Tetrahydrocannabinol: NOT DETECTED

## 2016-09-12 LAB — URINE MICROSCOPIC-ADD ON: Squamous Epithelial / LPF: NONE SEEN

## 2016-09-12 LAB — I-STAT CG4 LACTIC ACID, ED: LACTIC ACID, VENOUS: 1.73 mmol/L (ref 0.5–1.9)

## 2016-09-12 LAB — ETHANOL: Alcohol, Ethyl (B): <5 mg/dL (ref <5)

## 2016-09-12 LAB — PROTIME-INR
INR: 1.93
PROTHROMBIN TIME: 22.4 s — AB (ref 11.4–15.2)

## 2016-09-12 LAB — LIPASE, BLOOD: Lipase: 27 U/L (ref 11–51)

## 2016-09-12 LAB — APTT: APTT: 48 s — AB (ref 24–36)

## 2016-09-12 MED ORDER — SERTRALINE HCL 50 MG PO TABS
75.0000 mg | ORAL_TABLET | Freq: Every day | ORAL | Status: DC
  Administered 2016-09-13 – 2016-09-14 (×2): 75 mg via ORAL
  Filled 2016-09-12 (×2): qty 1

## 2016-09-12 MED ORDER — SODIUM CHLORIDE 0.9 % IV SOLN
INTRAVENOUS | Status: DC
  Administered 2016-09-13 – 2016-09-14 (×3): via INTRAVENOUS

## 2016-09-12 MED ORDER — POLYETHYLENE GLYCOL 3350 17 G PO PACK
17.0000 g | PACK | Freq: Every day | ORAL | Status: DC
  Administered 2016-09-13 – 2016-09-15 (×3): 17 g via ORAL
  Filled 2016-09-12 (×3): qty 1

## 2016-09-12 MED ORDER — ACETAMINOPHEN 325 MG PO TABS: 650.0000 mg | ORAL_TABLET | Freq: Four times a day (QID) | ORAL | Status: DC | PRN

## 2016-09-12 MED ORDER — LORAZEPAM 1 MG PO TABS
1.0000 mg | ORAL_TABLET | ORAL | Status: DC
  Administered 2016-09-13 – 2016-09-14 (×2): 1 mg via ORAL
  Filled 2016-09-12 (×2): qty 1

## 2016-09-12 MED ORDER — LORAZEPAM 0.5 MG PO TABS
0.5000 mg | ORAL_TABLET | ORAL | Status: DC
  Administered 2016-09-13 – 2016-09-15 (×8): 0.5 mg via ORAL
  Filled 2016-09-12 (×8): qty 1

## 2016-09-12 MED ORDER — SODIUM CHLORIDE 0.9 % IV BOLUS (SEPSIS)
500.0000 mL | Freq: Once | INTRAVENOUS | Status: AC
  Administered 2016-09-12: 500 mL via INTRAVENOUS

## 2016-09-12 MED ORDER — KETOROLAC TROMETHAMINE 15 MG/ML IJ SOLN: 15.0000 mg | Freq: Four times a day (QID) | INTRAMUSCULAR | Status: DC | PRN

## 2016-09-12 MED ORDER — ACETAMINOPHEN 650 MG RE SUPP: 650.0000 mg | Freq: Four times a day (QID) | RECTAL | Status: DC | PRN

## 2016-09-12 MED ORDER — LORATADINE 10 MG PO TABS
10.0000 mg | ORAL_TABLET | Freq: Every day | ORAL | Status: DC
  Administered 2016-09-13 – 2016-09-15 (×3): 10 mg via ORAL
  Filled 2016-09-12 (×3): qty 1

## 2016-09-12 MED ORDER — DEXTROSE 5 % IV SOLN
1.0000 g | INTRAVENOUS | Status: DC
  Administered 2016-09-12 – 2016-09-14 (×3): 1 g via INTRAVENOUS
  Filled 2016-09-12 (×4): qty 10

## 2016-09-12 MED ORDER — ONDANSETRON HCL 4 MG/2ML IJ SOLN: 4.0000 mg | Freq: Four times a day (QID) | INTRAMUSCULAR | Status: DC | PRN

## 2016-09-12 MED ORDER — CHLORHEXIDINE GLUCONATE 0.12 % MT SOLN
5.0000 mL | Freq: Two times a day (BID) | OROMUCOSAL | Status: DC
  Administered 2016-09-13 – 2016-09-15 (×5): 5 mL via OROMUCOSAL
  Filled 2016-09-12 (×5): qty 15

## 2016-09-12 MED ORDER — ACETAMINOPHEN 325 MG PO TABS
650.0000 mg | ORAL_TABLET | Freq: Once | ORAL | Status: AC
  Administered 2016-09-12: 650 mg via ORAL
  Filled 2016-09-12: qty 2

## 2016-09-12 MED ORDER — OMEGA-3-ACID ETHYL ESTERS 1 G PO CAPS
1.0000 g | ORAL_CAPSULE | Freq: Every day | ORAL | Status: DC
  Administered 2016-09-14 – 2016-09-15 (×2): 1 g via ORAL
  Filled 2016-09-12 (×3): qty 1

## 2016-09-12 MED ORDER — VITAMIN E 180 MG (400 UNIT) PO CAPS
400.0000 [IU] | ORAL_CAPSULE | Freq: Every day | ORAL | Status: DC
  Administered 2016-09-13 – 2016-09-15 (×3): 400 [IU] via ORAL
  Filled 2016-09-12 (×3): qty 1

## 2016-09-12 MED ORDER — LEVOTHYROXINE SODIUM 112 MCG PO TABS
112.0000 ug | ORAL_TABLET | Freq: Every day | ORAL | Status: DC
  Administered 2016-09-13: 112 ug via ORAL
  Filled 2016-09-12: qty 1

## 2016-09-12 MED ORDER — VITAMIN D3 25 MCG (1000 UNIT) PO TABS
1000.0000 [IU] | ORAL_TABLET | Freq: Every day | ORAL | Status: DC
  Administered 2016-09-13 – 2016-09-15 (×3): 1000 [IU] via ORAL
  Filled 2016-09-12 (×3): qty 1

## 2016-09-12 MED ORDER — SIMVASTATIN 10 MG PO TABS
20.0000 mg | ORAL_TABLET | Freq: Every day | ORAL | Status: DC
  Administered 2016-09-13 – 2016-09-14 (×2): 20 mg via ORAL
  Filled 2016-09-12 (×2): qty 2

## 2016-09-12 MED ORDER — ONDANSETRON HCL 4 MG PO TABS: 4.0000 mg | ORAL_TABLET | Freq: Four times a day (QID) | ORAL | Status: DC | PRN

## 2016-09-12 MED ORDER — RIVAROXABAN 20 MG PO TABS
20.0000 mg | ORAL_TABLET | Freq: Every day | ORAL | Status: DC
  Administered 2016-09-13 – 2016-09-14 (×2): 20 mg via ORAL
  Filled 2016-09-12 (×2): qty 1

## 2016-09-12 NOTE — ED Notes (Signed)
ED Provider at bedside. 

## 2016-09-12 NOTE — ED Notes (Addendum)
Discussed sepsis protocol with  EDPA Kayla. [facility]us[facility] at [facility].

## 2016-09-12 NOTE — ED Notes (Signed)
Urine and POC collected and sent to lab.

## 2016-09-12 NOTE — ED Notes (Signed)
Hospitalist at bedside 

## 2016-09-12 NOTE — H&P (Signed)
History and Physical:    Laura Bird   ZOX:[id number] DOB: 07/12/1925 DOA: 09/12/2016  Referring MD/provider: Doug SouSam Jacubowitz, MD PCP: Herschel SenegalHABER, MICHELE, MD   Patient coming from: Home  Chief Complaint: Generalized weakness, altered mental status  History of Present Illness:   Laura Bird is an 82 y.o. female with a PMH of stroke, DVT on chronic anticoagulation, and advanced dementia who was brought to the hospital for evaluation by her caregiver secondary to the onset of generalized weakness and changes in her baseline level of mental status that began earlier today. Patient is unable to provide any additional history. According to the caregiver, she attempted to get her off the commode and the patient was unable to stand up. She seemed to have trouble moving her left leg. EMS was called and patient was combative with the ambulance personnel. At baseline, the patient is wheelchair-bound.  ED Course:  The patient was felt to have early sepsis. She did not have any focal neurological deficits to suggest stroke. She was febrile with an elevated respiratory rate. Source likely urinary system. Chest x-ray negative for acute cardiopulmonary findings. CT of the head negative for intracranial abnormalities. Labs notable for a WBC of 16. Lactic acid 1.73. Urinalysis showed turbid urine with positive nitrites and a large amount of leukocytes. She was subsequently placed on empiric Rocephin.  ROS:   Review of Systems  Unable to perform ROS: Dementia   Past Medical History:   Past Medical History:  Diagnosis Date  . Arthritis   . Constipation   . Dementia   . DJD (degenerative joint disease)   . DVT, lower extremity (HCC) 12/23/2015  . Stroke (HCC)   . Thrombophlebitis   . Thyroid disease    hyperpara    Past Surgical History:   Past Surgical History:  Procedure Laterality Date  . ABDOMINAL HYSTERECTOMY      Social History:   Social History   Social History  . Marital status:  Widowed    Spouse name: N/A  . Number of children: N/A  . Years of education: N/A   Occupational History  . Not on file.   Social History Main Topics  . Smoking status: Never Smoker  . Smokeless tobacco: Never Used  . Alcohol use No  . Drug use: No  . Sexual activity: No   Other Topics Concern  . Not on file   Social History Narrative   Has 24/7 care at home between daughter and hired caregivers. She is a widow.  She has two adult children.  No tobacco, EtOH, or illicit drug use.    Allergies   Codeine and Penicillins  Family history:   Family History  Problem Relation Age of Onset  . Diabetes Brother      Current Medications:   Prior to Admission medications   Medication Sig Start Date End Date Taking? Authorizing Provider  cetirizine (ZYRTEC) 10 MG tablet Take 10 mg by mouth daily.   Yes Historical Provider, MD  chlorhexidine (PERIDEX) 0.12 % solution Use as directed 5 mLs in the mouth or throat 2 (two) times daily. 10/27/15  Yes Historical Provider, MD  cholecalciferol (VITAMIN D) 1000 UNITS tablet Take 1,000 Units by mouth daily.   Yes Historical Provider, MD  levothyroxine (SYNTHROID, LEVOTHROID) 112 MCG tablet Take 112 mcg by mouth daily before breakfast. Monday, Wednesday, Friday and Saturday.   Yes Historical Provider, MD  LORazepam (ATIVAN) 0.5 MG tablet Take 0.5-1 mg by mouth 4 (four) times daily.  Take 0.5 mg in the morning, Take 1 mg at noon. Take 0.5 in the afternoon and evening.   Yes Historical Provider, MD  Melatonin 3 MG CAPS Take 3 mg by mouth at bedtime.   Yes Historical Provider, MD  Omega-3 Fatty Acids (FISH OIL) 1200 MG CAPS Take 1,200 mg by mouth daily.   Yes Historical Provider, MD  polyethylene glycol (MIRALAX / GLYCOLAX) packet Take 17 g by mouth daily.   Yes Historical Provider, MD  sertraline (ZOLOFT) 50 MG tablet Take 75 mg by mouth daily at 6 PM.    Yes Historical Provider, MD  simvastatin (ZOCOR) 20 MG tablet Take 20 mg by mouth daily at 6 PM.     Yes Historical Provider, MD  vitamin E 400 UNIT capsule Take 400 Units by mouth daily.   Yes Historical Provider, MD  XARELTO 20 MG TABS tablet Take 20 mg by mouth daily at 12 noon. 11/29/15  Yes Historical Provider, MD  cefpodoxime (VANTIN) 200 MG tablet Take 1 tablet (200 mg total) by mouth every 12 (twelve) hours. Patient not taking: Reported on 09/12/2016 12/26/15   Leroy Sea, MD  dextromethorphan-guaiFENesin (TUSSIN DM) 10-100 MG/5ML liquid Take 10 mLs by mouth every 4 (four) hours as needed for cough. Patient not taking: Reported on 09/12/2016 12/26/15   Leroy Sea, MD  oseltamivir (TAMIFLU) 30 MG capsule Take 1 capsule (30 mg total) by mouth 2 (two) times daily. Patient not taking: Reported on 09/12/2016 12/26/15   Leroy Sea, MD    Physical Exam:   Vitals:   09/12/16 2114 09/12/16 2147 09/12/16 2245 09/12/16 2346  BP:   (!) 139/122   Pulse:   86 78  Resp:   22 (!) 22  Temp: 100.9 F (38.3 C)   97.5 F (36.4 C)  TempSrc:    Axillary  SpO2:   100% 95%  Weight:  70.3 kg (155 lb)  72.9 kg (160 lb 11.5 oz)  Height:  5\' 3"  (1.6 m)  5\' 3"  (1.6 m)     Physical Exam: Blood pressure (!) 139/122, pulse 78, temperature 97.5 F (36.4 C), temperature source Axillary, resp. rate (!) 22, height 5\' 3"  (1.6 m), weight 72.9 kg (160 lb 11.5 oz), SpO2 95 %. Gen: No acute distress. Elderly female. Head: Normocephalic, atraumatic. Eyes: Pupils equal, round and reactive to light. Extraocular movements intact.  Sclerae nonicteric. No lid lag. Mouth: Oropharynx reveals dry mucous membranes. Dentition is fair. Neck: Supple, no thyromegaly, no lymphadenopathy, no jugular venous distention. Chest: Lungs are clear to auscultation with good air movement. No rales, rhonchi or wheezes.  CV: Heart sounds are regular with an S1, S2. No murmurs, rubs, clicks, or gallops.  Abdomen: Soft, nontender, nondistended with normal active bowel sounds. No hepatosplenomegaly or palpable  masses. Extremities: Extremities are without clubbing, edema, or cyanosis. Pedal pulses 2+.  Skin: Warm and dry. No rashes, lesions or wounds. Neuro: Somnolent, disoriented; grossly nonfocal. Spontaneously moves all 4 extremities. Psych: Insight is poor and judgment is impaired. Mood and affect irritable to stimulation.   Data Review:    Labs: Basic Metabolic Panel:  Recent Labs Lab 09/12/16 2015 09/12/16 2021  NA 140 140  K 4.2 4.2  CL 106 103  CO2 25  --   GLUCOSE 136* 135*  BUN 15 14  CREATININE 0.96 0.90  CALCIUM 9.1  --    Liver Function Tests:  Recent Labs Lab 09/12/16 2015  AST 20  ALT 14  ALKPHOS 53  BILITOT 0.8  PROT 7.3  ALBUMIN 4.0    Recent Labs Lab 09/12/16 2015  LIPASE 27   No results for input(s): AMMONIA in the last 168 hours. CBC:  Recent Labs Lab 09/12/16 2015 09/12/16 2021  WBC 16.0*  --   NEUTROABS 12.8*  --   HGB 13.7 13.9  HCT 40.3 41.0  MCV 85.0  --   PLT 309  --    Cardiac Enzymes: No results for input(s): CKTOTAL, CKMB, CKMBINDEX, TROPONINI in the last 168 hours.  BNP (last 3 results) No results for input(s): PROBNP in the last 8760 hours. CBG: No results for input(s): GLUCAP in the last 168 hours.  Urinalysis    Component Value Date/Time   COLORURINE AMBER (A) 09/12/2016 2045   APPEARANCEUR TURBID (A) 09/12/2016 2045   LABSPEC 1.021 09/12/2016 2045   PHURINE 7.5 09/12/2016 2045   GLUCOSEU NEGATIVE 09/12/2016 2045   HGBUR MODERATE (A) 09/12/2016 2045   BILIRUBINUR NEGATIVE 09/12/2016 2045   KETONESUR NEGATIVE 09/12/2016 2045   PROTEINUR 30 (A) 09/12/2016 2045   NITRITE POSITIVE (A) 09/12/2016 2045   LEUKOCYTESUR LARGE (A) 09/12/2016 2045      Radiographic Studies: Ct Head Wo Contrast  Result Date: 09/12/2016 CLINICAL DATA:  More confusion, baseline dementia of EXAM: CT HEAD WITHOUT CONTRAST TECHNIQUE: Contiguous axial images were obtained from the base of the skull through the vertex without intravenous  contrast. COMPARISON:  12/23/2015 FINDINGS: Brain: There is no acute territorial infarction, intracranial hemorrhage or extra-axial fluid collection identified. There is no focal mass, mass effect or midline shift. Moderate periventricular, subcortical and deep white matter hypodensities consistent with small vessel disease, similar compared to previous exam. Moderate atrophy. Ventricular enlargement similar compared to prior and felt secondary to atrophy. Vascular: No hyperdense vessels. Carotid artery calcifications are present. Skull: Mastoid air cells are clear. There is no skull fracture identified. Sinuses/Orbits: Imaged paranasal sinuses appear clear. Limited visualization of the orbits. Other: None IMPRESSION: 1. No CT evidence for acute intracranial abnormality. 2. Atrophy. Moderate periventricular, subcortical and deep white matter small vessel ischemic changes. Electronically Signed   By: Jasmine PangKim  Fujinaga M.D.   On: 09/12/2016 20:41   Dg Chest Port 1 View  Result Date: 09/12/2016 CLINICAL DATA:  Worsening confusion. Combative. History of dementia. EXAM: PORTABLE CHEST 1 VIEW COMPARISON:  Chest radiograph December 23, 2015 FINDINGS: The cardiac silhouette is mildly enlarged and unchanged. Mildly calcified aortic knob. No pleural effusion or focal consolidation. No pneumothorax. Soft tissue planes and included osseous structures are nonsuspicious. Posterior LEFT humeral head deformity most compatible with Hill-Sachs deformity. IMPRESSION: Mild cardiomegaly. No acute pulmonary process for this low inspiratory portable examination. Electronically Signed   By: Awilda Metroourtnay  Bloomer M.D.   On: 09/12/2016 20:04    EKG: Independently reviewed. Sinus rhythm at 86 bpm.   Assessment/Plan:   Principal Problem:   Sepsis (HCC) Secondary to infection of urinary tract Urinalysis findings consistent with UTI as being the source of her sepsis. Patient had tachypnea and fever, but lactic acid was not elevated. WBC was  16. Urine culture sent. Continue empiric Rocephin. Patient's altered mental status and combative behavior makes it unsafe for her to attempt oral antibiotics and therefore she needs inpatient management for IV antibiotics.  Active Problems:   Dementia Continue Zoloft and Ativan as needed.    Hypothyroidism Continue Synthroid.    DVT, lower extremity (HCC) Continue Xarelto.    History of stroke Continue risk factor modification. On statin therapy.   Attestation regarding  necessity of inpatient status:   The appropriate admission status for this patient is INPATIENT. Inpatient status is judged to be reasonable and necessary in order to provide the required intensity of service to ensure the patient's safety. The patient's presenting symptoms, physical exam findings, and initial radiographic and laboratory data in the context of their chronic comorbidities is felt to place them at high risk for further clinical deterioration. Furthermore, it is not anticipated that the patient will be medically stable for discharge from the hospital within 2 midnights of admission. The following factors support the admission status of inpatient.    The patient's presenting symptoms include acute encephalopathy with altered mental status and weakness/combative behavior.  The worrisome physical exam findings include tachypnea, somnolence.  The initial radiographic and laboratory data are worrisome because of significant pyuria/bactiuria.  The chronic co-morbidities include advanced dementia, history of stroke, history of DVT, hypothyroidism.   * I certify that at the point of admission it is my clinical judgment that the patient will require inpatient hospital care spanning beyond 2 midnights from the point of admission due to high intensity of service, high risk for further deterioration and high frequency of surveillance required.*  Other information:   DVT prophylaxis: Lovenox ordered. Code Status:  Full code. Family Communication: Caregiver at the bedside. Disposition Plan: Home when sepsis resolved and the patient can safely be transitioned to oral antibiotics. Consults called: None. Admission status: Inpatient.  The medical decision making on this patient was of high complexity and the patient is at high risk for clinical deterioration, therefore this is a level 3 visit.  Diella Gillingham [facility] Pager [phone] Cell: [phone]   If 7PM-7AM, please contact night-coverage [email or web address] Password [email or web address] 09/13/2016, 12:01 AM

## 2016-09-12 NOTE — Progress Notes (Signed)
Pharmacy Antibiotic Note  Laura Bird is a 82 y.o. female with hx dementia presented to the ED on 11/22 with c/o increased confusion (from baseline) and fever.  UA with large leukocytes and nitrate.  To start ceftriaxone for UTI.   Plan: - ceftriaxone 1 gm IV q24h - Of note, patient has Penicillin listed in allergy field, but had cefpodoxime prescribed to her in the past.  RN instructed to monitor patient closely while administering first ceftriaxone dose - pharmacy will sign off since current dose is appropriate for UTI indication and no renal adjustment is needed with this abx.  Re-consult us if need further   _______________________     Temp (24hrs), Avg:100.9 F (38.3 C), Min:100.9 F (38.3 C), Max:100.9 F (38.3 C)   Recent Labs Lab 09/12/16 2015 09/12/16 2021  WBC 16.0*  --   CREATININE 0.96 0.90  LATICACIDVEN  --  1.73    CrCl cannot be calculated (Unknown ideal weight.).    Allergies  Allergen Reactions  . Codeine Other (See Comments)    unknown  . Penicillins Other (See Comments)      Has patient had a PCN reaction causing immediate rash, facial/tongue/throat swelling, SOB or lightheadedness with hypotension: very mild rash Has patient had a PCN reaction causing severe rash involving mucus membranes or skin necrosis: unknown Has patient had a PCN reaction that required hospitalization unknown Has patient had a PCN reaction occurring within the last 10 years: unknown If all of the above answers are "NO", then may proceed with Cephalosporin use.    Thank you for allowing pharmacy to be a part of this patient's care.  Lucia Gaskinsham, Juron Vorhees P 09/12/2016 9:24 PM

## 2016-09-12 NOTE — ED Notes (Signed)
Patient in CT will attempt blood work upon return.

## 2016-09-12 NOTE — ED Provider Notes (Signed)
[facility]-EMERGENCY DEPT Provider Note   CSN: [id number] Arrival date & time: 09/12/16  1900     History   Chief Complaint Chief Complaint  Patient presents with  . Altered Mental Status    HPI Laura Bird is a 82 y.o. female.  LEVEL 5 CAVEAT--DEMENTIA  HPI Patient presents from home for altered mental status since 9:30 this morning. Caregiver states that Laura Bird seemed to be more weak than normal. Laura Bird had difficulty getting up from the toilet this morning. Denies fever, chills, nausea, vomiting, cough, shortness of breath, chest pain, abdominal pain. Caregiver and daughter concerned Laura Bird has a UTI here they state this is typically how Laura Bird gets. No medications prior to arrival. Nothing makes her symptoms better or worse. Symptom onset sudden, constant, unchanging.  Past Medical History:  Diagnosis Date  . Arthritis   . Constipation   . Dementia   . DJD (degenerative joint disease)   . DVT, lower extremity (HCC) 12/23/2015  . Stroke (HCC)   . Thrombophlebitis   . Thyroid disease    hyperpara    Patient Active Problem List   Diagnosis Date Noted  . History of stroke 09/13/2016  . Infection of urinary tract 09/13/2016  . Sepsis (HCC) 12/23/2015  . Dementia 12/23/2015  . Hypothyroidism 12/23/2015  . DVT, lower extremity (HCC) 12/23/2015    Past Surgical History:  Procedure Laterality Date  . ABDOMINAL HYSTERECTOMY      OB History    No data available       Home Medications    Prior to Admission medications   Medication Sig Start Date End Date Taking? Authorizing Provider  cetirizine (ZYRTEC) 10 MG tablet Take 10 mg by mouth daily.   Yes Historical Provider, MD  chlorhexidine (PERIDEX) 0.12 % solution Use as directed 5 mLs in the mouth or throat 2 (two) times daily. 10/27/15  Yes Historical Provider, MD  cholecalciferol (VITAMIN D) 1000 UNITS tablet Take 1,000 Units by mouth daily.   Yes Historical Provider, MD  levothyroxine (SYNTHROID, LEVOTHROID) 112 MCG tablet Take  112 mcg by mouth daily before breakfast. Monday, Wednesday, Friday and Saturday.   Yes Historical Provider, MD  LORazepam (ATIVAN) 0.5 MG tablet Take 0.5-1 mg by mouth 4 (four) times daily. Take 0.5 mg in the morning, Take 1 mg at noon. Take 0.5 in the afternoon and evening.   Yes Historical Provider, MD  Melatonin 3 MG CAPS Take 3 mg by mouth at bedtime.   Yes Historical Provider, MD  Omega-3 Fatty Acids (FISH OIL) 1200 MG CAPS Take 1,200 mg by mouth daily.   Yes Historical Provider, MD  polyethylene glycol (MIRALAX / GLYCOLAX) packet Take 17 g by mouth daily.   Yes Historical Provider, MD  sertraline (ZOLOFT) 50 MG tablet Take 75 mg by mouth daily at 6 PM.    Yes Historical Provider, MD  simvastatin (ZOCOR) 20 MG tablet Take 20 mg by mouth daily at 6 PM.    Yes Historical Provider, MD  vitamin E 400 UNIT capsule Take 400 Units by mouth daily.   Yes Historical Provider, MD  XARELTO 20 MG TABS tablet Take 20 mg by mouth daily at 12 noon. 11/29/15  Yes Historical Provider, MD  cefpodoxime (VANTIN) 200 MG tablet Take 1 tablet (200 mg total) by mouth every 12 (twelve) hours. Patient not taking: Reported on 09/12/2016 12/26/15   Leroy SeaPrashant K Singh, MD  dextromethorphan-guaiFENesin (TUSSIN DM) 10-100 MG/5ML liquid Take 10 mLs by mouth every 4 (four) hours as needed for  cough. Patient not taking: Reported on 09/12/2016 12/26/15   Leroy Sea, MD  oseltamivir (TAMIFLU) 30 MG capsule Take 1 capsule (30 mg total) by mouth 2 (two) times daily. Patient not taking: Reported on 09/12/2016 12/26/15   Leroy Sea, MD    Family History Family History  Problem Relation Age of Onset  . Diabetes Brother     Social History Social History  Substance Use Topics  . Smoking status: Never Smoker  . Smokeless tobacco: Never Used  . Alcohol use No     Allergies   Codeine and Penicillins   Review of Systems Review of Systems All other systems negative unless otherwise stated in HPI   Physical  Exam Updated Vital Signs BP (!) 139/122 Comment: patient moving arm  Pulse 78   Temp 97.5 F (36.4 C) (Axillary)   Resp (!) 22   Ht 5\' 3"  (1.6 m)   Wt 72.9 kg   SpO2 95%   BMI 28.47 kg/m   Physical Exam  Constitutional: Laura Bird appears well-developed and well-nourished.  Non-toxic appearance. Laura Bird does not have a sickly appearance. Laura Bird does not appear ill. No distress.  HENT:  Head: Normocephalic and atraumatic.  Mouth/Throat: Oropharynx is clear and moist.  Eyes: Conjunctivae are normal.  Neck: Normal range of motion. Neck supple.  Cardiovascular: Normal rate, regular rhythm and normal heart sounds.   Pulmonary/Chest: Effort normal and breath sounds normal. No accessory muscle usage or stridor. No respiratory distress. Laura Bird has no wheezes. Laura Bird has no rhonchi. Laura Bird has no rales.  Oxygen saturation 100% on RA with RR 22.  Abdominal: Soft. Bowel sounds are normal. Laura Bird exhibits no distension. There is no tenderness. There is no rebound and no guarding.  Musculoskeletal: Normal range of motion.  Lymphadenopathy:    Laura Bird has no cervical adenopathy.  Neurological: Laura Bird is alert.  Speech clear without dysarthria. Patient is not oriented to person, place, or time.  This is her baseline.  Moves all extremities spontaneously.  Possibly decreased strength of the RLE.  Normal sensation. Patient does not follow commands, is difficult to perform complete neurological exam.   Skin: Skin is warm and dry.  Psychiatric: Laura Bird has a normal mood and affect. Her behavior is normal.     ED Treatments / Results  Labs (all labs ordered are listed, but only abnormal results are displayed) Labs Reviewed  COMPREHENSIVE METABOLIC PANEL - Abnormal; Notable for the following:       Result Value   Glucose, Bld 136 (*)    GFR calc non Af Amer 51 (*)    GFR calc Af Amer 59 (*)    All other components within normal limits  CBC WITH DIFFERENTIAL/PLATELET - Abnormal; Notable for the following:    WBC 16.0 (*)     Neutro Abs 12.8 (*)    Monocytes Absolute 1.1 (*)    All other components within normal limits  URINALYSIS, ROUTINE W REFLEX MICROSCOPIC (NOT AT [facility]) - Abnormal; Notable for the following:    Color, Urine AMBER (*)    APPearance TURBID (*)    Hgb urine dipstick MODERATE (*)    Protein, ur 30 (*)    Nitrite POSITIVE (*)    Leukocytes, UA LARGE (*)    All other components within normal limits  PROTIME-INR - Abnormal; Notable for the following:    Prothrombin Time 22.4 (*)    All other components within normal limits  APTT - Abnormal; Notable for the following:    aPTT  48 (*)    All other components within normal limits  RAPID URINE DRUG SCREEN, HOSP PERFORMED - Abnormal; Notable for the following:    Benzodiazepines POSITIVE (*)    All other components within normal limits  URINE MICROSCOPIC-ADD ON - Abnormal; Notable for the following:    Bacteria, UA FEW (*)    All other components within normal limits  I-STAT CHEM 8, ED - Abnormal; Notable for the following:    Glucose, Bld 135 (*)    Calcium, Ion 1.13 (*)    All other components within normal limits  URINE CULTURE  LIPASE, BLOOD  ETHANOL  I-STAT CG4 LACTIC ACID, ED  I-STAT TROPOININ, ED  POC OCCULT BLOOD, ED  I-STAT CG4 LACTIC ACID, ED    EKG  EKG Interpretation  Date/Time:  Wednesday September 12 2016 19:10:48 EST Ventricular Rate:  86 PR Interval:    QRS Duration: 76 QT Interval:  383 QTC Calculation: 459 R Axis:   71 Text Interpretation:  Sinus rhythm Baseline wander in lead(s) V1 No significant change since last tracing Confirmed by Ethelda Chick  MD, SAM [phone]) on 09/12/2016 7:36:09 PM       Radiology Ct Head Wo Contrast  Result Date: 09/12/2016 CLINICAL DATA:  More confusion, baseline dementia of EXAM: CT HEAD WITHOUT CONTRAST TECHNIQUE: Contiguous axial images were obtained from the base of the skull through the vertex without intravenous contrast. COMPARISON:  12/23/2015 FINDINGS: Brain: There is no  acute territorial infarction, intracranial hemorrhage or extra-axial fluid collection identified. There is no focal mass, mass effect or midline shift. Moderate periventricular, subcortical and deep white matter hypodensities consistent with small vessel disease, similar compared to previous exam. Moderate atrophy. Ventricular enlargement similar compared to prior and felt secondary to atrophy. Vascular: No hyperdense vessels. Carotid artery calcifications are present. Skull: Mastoid air cells are clear. There is no skull fracture identified. Sinuses/Orbits: Imaged paranasal sinuses appear clear. Limited visualization of the orbits. Other: None IMPRESSION: 1. No CT evidence for acute intracranial abnormality. 2. Atrophy. Moderate periventricular, subcortical and deep white matter small vessel ischemic changes. Electronically Signed   By: Jasmine Pang M.D.   On: 09/12/2016 20:41   Dg Chest Port 1 View  Result Date: 09/12/2016 CLINICAL DATA:  Worsening confusion. Combative. History of dementia. EXAM: PORTABLE CHEST 1 VIEW COMPARISON:  Chest radiograph December 23, 2015 FINDINGS: The cardiac silhouette is mildly enlarged and unchanged. Mildly calcified aortic knob. No pleural effusion or focal consolidation. No pneumothorax. Soft tissue planes and included osseous structures are nonsuspicious. Posterior LEFT humeral head deformity most compatible with Hill-Sachs deformity. IMPRESSION: Mild cardiomegaly. No acute pulmonary process for this low inspiratory portable examination. Electronically Signed   By: Awilda Metro M.D.   On: 09/12/2016 20:04    Procedures Procedures (including critical care time)  Medications Ordered in ED Medications  cefTRIAXone (ROCEPHIN) 1 g in dextrose 5 % 50 mL IVPB (0 g Intravenous Stopped 09/12/16 2333)  loratadine (CLARITIN) tablet 10 mg (not administered)  chlorhexidine (PERIDEX) 0.12 % solution 5 mL (5 mLs Mouth/Throat Not Given 09/13/16 0000)  rivaroxaban (XARELTO)  tablet 20 mg (not administered)  cholecalciferol (VITAMIN D) tablet 1,000 Units (not administered)  levothyroxine (SYNTHROID, LEVOTHROID) tablet 112 mcg (not administered)  LORazepam (ATIVAN) tablet 0.5 mg (0.5 mg Oral Given 09/13/16 0010)  omega-3 acid ethyl esters (LOVAZA) capsule 1 g (not administered)  polyethylene glycol (MIRALAX / GLYCOLAX) packet 17 g (not administered)  sertraline (ZOLOFT) tablet 75 mg (not administered)  simvastatin (ZOCOR) tablet 20  mg (not administered)  vitamin E capsule 400 Units (not administered)  0.9 %  sodium chloride infusion ( Intravenous New Bag/Given 09/13/16 0010)  acetaminophen (TYLENOL) tablet 650 mg (not administered)    Or  acetaminophen (TYLENOL) suppository 650 mg (not administered)  ketorolac (TORADOL) 15 MG/ML injection 15 mg (not administered)  ondansetron (ZOFRAN) tablet 4 mg (not administered)    Or  ondansetron (ZOFRAN) injection 4 mg (not administered)  LORazepam (ATIVAN) tablet 1 mg (not administered)  acetaminophen (TYLENOL) tablet 650 mg (650 mg Oral Given 09/12/16 2120)  sodium chloride 0.9 % bolus 500 mL (0 mLs Intravenous Stopped 09/12/16 2332)     Initial Impression / Assessment and Plan / ED Course  I have reviewed the triage vital signs and the nursing notes.  Pertinent labs & imaging results that were available during my care of the patient were reviewed by me and considered in my medical decision making (see chart for details).  Clinical Course    Patient with sepsis secondary to UTI causing altered mental status and weakness.  No evidence of end organ damage.  Patient received IV Rocephin and fluids.  Patient will need admission for IV antibiotics and further management.   Case has been discussed with and seen by Dr. Ethelda ChickJacubowitz who agrees with the above plan for admission.   Final Clinical Impressions(s) / ED Diagnoses   Final diagnoses:  Sepsis, due to unspecified organism [facility](HCC)    New Prescriptions Current  Discharge Medication List       Cheri FowlerKayla Rhiannan Kievit, PA-C 09/13/16 0050    Doug SouSam Jacubowitz, MD 09/13/16 [phone]

## 2016-09-12 NOTE — ED Notes (Signed)
Bed: [id number] Expected date:  Expected time:  Means of arrival:  Comments: EMS- elderly, more confused than normal

## 2016-09-12 NOTE — ED Notes (Signed)
Pt transported to CT via stretcher and CT tech at this time. Accompanied by family and caregiver.

## 2016-09-12 NOTE — ED Provider Notes (Signed)
Level V caveat dementia. History is obtained from patient's daughter and from her caregiver. Patient had some difficulty moving her right leg earlier this morning. She could not assist her caregiver in getting up off the commode this morning. Patient is wheelchair bound at baseline Patient noted to be febrile to Make. Code sepsis called based on Sirs criteria temperature and respiratory rate. On exam frail chronically ill-appearing female HEENT exam no facial asymmetry neck supple lungs clear auscultation heart regular rate and rhythm abdomen nondistended nontender all 4 extremities with muscular atrophy. She is able to move all 4 extremities.   Doug SouSam Emma Schupp, MD 09/12/16 2110

## 2016-09-12 NOTE — ED Triage Notes (Addendum)
Per [facility] and daughter, pt has been more confused than baseline dementia today. Pt would not get up from commode when daughter decided to call 911. Per [facility] "it took us a long time to get her up from the commode when we got there." Pt combative during assessment at this time.

## 2016-09-13 DIAGNOSIS — Z8673 Personal history of transient ischemic attack (TIA), and cerebral infarction without residual deficits: Secondary | ICD-10-CM

## 2016-09-13 DIAGNOSIS — A419 Sepsis, unspecified organism: Principal | ICD-10-CM

## 2016-09-13 DIAGNOSIS — F0150 Vascular dementia without behavioral disturbance: Secondary | ICD-10-CM

## 2016-09-13 DIAGNOSIS — N390 Urinary tract infection, site not specified: Secondary | ICD-10-CM | POA: Diagnosis present

## 2016-09-13 MED ORDER — LEVOTHYROXINE SODIUM 112 MCG PO TABS
112.0000 ug | ORAL_TABLET | ORAL | Status: DC
  Administered 2016-09-14: 112 ug via ORAL
  Filled 2016-09-13 (×2): qty 1

## 2016-09-13 NOTE — Progress Notes (Signed)
PROGRESS NOTE  Laura CopaVivian Bird WJX:[id number] DOB: 04/22/1925 DOA: 09/12/2016 PCP: Herschel SenegalHABER, MICHELE, MD  HPI/Recap of past 24 hours:  Demented elderly female not able to provide history, not follow commands, daughter in room  Assessment/Plan: Principal Problem:   Sepsis (HCC) Active Problems:   Dementia   Hypothyroidism   DVT, lower extremity (HCC)   History of stroke   Infection of urinary tract  Sepsis with fever, leukocytosis, metabolic encephalopathy, source of infection likely from uti Ct head no acute findings,  Urine culture and blood culture pending, she is started empirically on rocephin since admission, she is on ivf     Hypothyroidism Continue Synthroid.    DVT, lower extremity (HCC) Continue Xarelto.    History of stroke Continue risk factor modification. On statin therapy  Advanced Dementia/FTT Continue Zoloft and Ativan as needed. Baseline wheelchair bound, total care at home  Code Status: full, confirmed with daughter  Family Communication: patient and daughter at bedside  Disposition Plan: home in 1-2 days   Consultants:  none  Procedures:  none  Antibiotics:  Rocephin from admission   Objective: BP (!) 139/122 Comment: patient moving arm  Pulse 78   Temp 97.5 F (36.4 C) (Axillary)   Resp (!) 22   Ht 5\' 3"  (1.6 m)   Wt 72.9 kg (160 lb 11.5 oz)   SpO2 95%   BMI 28.47 kg/m   Intake/Output Summary (Last 24 hours) at 09/13/16 1013 Last data filed at 09/13/16 0600  Gross per 24 hour  Intake            487.5 ml  Output                0 ml  Net            487.5 ml   Filed Weights   09/12/16 2147 09/12/16 2346  Weight: 70.3 kg (155 lb) 72.9 kg (160 lb 11.5 oz)    Exam:   General:  Demented elderly female, does not seem in distress  Cardiovascular: RRR  Respiratory: CTABL  Abdomen: Soft/ND/NT, positive BS  Musculoskeletal: No Edema  Neuro: advanced dementia, only oriented to self, does not recognize daughter  Data  Reviewed: Basic Metabolic Panel:  Recent Labs Lab 09/12/16 2015 09/12/16 2021  NA 140 140  K 4.2 4.2  CL 106 103  CO2 25  --   GLUCOSE 136* 135*  BUN 15 14  CREATININE 0.96 0.90  CALCIUM 9.1  --    Liver Function Tests:  Recent Labs Lab 09/12/16 2015  AST 20  ALT 14  ALKPHOS 53  BILITOT 0.8  PROT 7.3  ALBUMIN 4.0    Recent Labs Lab 09/12/16 2015  LIPASE 27   No results for input(s): AMMONIA in the last 168 hours. CBC:  Recent Labs Lab 09/12/16 2015 09/12/16 2021  WBC 16.0*  --   NEUTROABS 12.8*  --   HGB 13.7 13.9  HCT 40.3 41.0  MCV 85.0  --   PLT 309  --    Cardiac Enzymes:   No results for input(s): CKTOTAL, CKMB, CKMBINDEX, TROPONINI in the last 168 hours. BNP (last 3 results) No results for input(s): BNP in the last 8760 hours.  ProBNP (last 3 results) No results for input(s): PROBNP in the last 8760 hours.  CBG: No results for input(s): GLUCAP in the last 168 hours.  No results found for this or any previous visit (from the past 240 hour(s)).   Studies: Ct Head Wo Contrast  Result Date: 09/12/2016 CLINICAL DATA:  More confusion, baseline dementia of EXAM: CT HEAD WITHOUT CONTRAST TECHNIQUE: Contiguous axial images were obtained from the base of the skull through the vertex without intravenous contrast. COMPARISON:  12/23/2015 FINDINGS: Brain: There is no acute territorial infarction, intracranial hemorrhage or extra-axial fluid collection identified. There is no focal mass, mass effect or midline shift. Moderate periventricular, subcortical and deep white matter hypodensities consistent with small vessel disease, similar compared to previous exam. Moderate atrophy. Ventricular enlargement similar compared to prior and felt secondary to atrophy. Vascular: No hyperdense vessels. Carotid artery calcifications are present. Skull: Mastoid air cells are clear. There is no skull fracture identified. Sinuses/Orbits: Imaged paranasal sinuses appear clear.  Limited visualization of the orbits. Other: None IMPRESSION: 1. No CT evidence for acute intracranial abnormality. 2. Atrophy. Moderate periventricular, subcortical and deep white matter small vessel ischemic changes. Electronically Signed   By: Jasmine PangKim  Fujinaga M.D.   On: 09/12/2016 20:41   Dg Chest Port 1 View  Result Date: 09/12/2016 CLINICAL DATA:  Worsening confusion. Combative. History of dementia. EXAM: PORTABLE CHEST 1 VIEW COMPARISON:  Chest radiograph December 23, 2015 FINDINGS: The cardiac silhouette is mildly enlarged and unchanged. Mildly calcified aortic knob. No pleural effusion or focal consolidation. No pneumothorax. Soft tissue planes and included osseous structures are nonsuspicious. Posterior LEFT humeral head deformity most compatible with Hill-Sachs deformity. IMPRESSION: Mild cardiomegaly. No acute pulmonary process for this low inspiratory portable examination. Electronically Signed   By: Awilda Metroourtnay  Bloomer M.D.   On: 09/12/2016 20:04    Scheduled Meds: . cefTRIAXone (ROCEPHIN)  IV  1 g Intravenous Q24H  . chlorhexidine  5 mL Mouth/Throat BID  . cholecalciferol  1,000 Units Oral Daily  . [START ON 09/14/2016] levothyroxine  112 mcg Oral Once per day on Mon Wed Fri Sat  . loratadine  10 mg Oral Daily  . LORazepam  0.5 mg Oral 3 times per day  . LORazepam  1 mg Oral Q24H  . omega-3 acid ethyl esters  1 g Oral Daily  . polyethylene glycol  17 g Oral Daily  . rivaroxaban  20 mg Oral Q1200  . sertraline  75 mg Oral q1800  . simvastatin  20 mg Oral q1800  . vitamin E  400 Units Oral Daily    Continuous Infusions: . sodium chloride 75 mL/hr at 09/13/16 0010     Time spent: 25mins  Jerusalem Wert MD, PhD  [facility] Pager [phone]. If 7PM-7AM, please contact night-coverage at [email or web address], password [email or web address] 09/13/2016, 10:13 AM  LOS: 1 day

## 2016-09-14 DIAGNOSIS — E038 Other specified hypothyroidism: Secondary | ICD-10-CM

## 2016-09-14 DIAGNOSIS — A4151 Sepsis due to Escherichia coli [E. coli]: Secondary | ICD-10-CM

## 2016-09-14 DIAGNOSIS — N390 Urinary tract infection, site not specified: Secondary | ICD-10-CM

## 2016-09-14 DIAGNOSIS — Z8673 Personal history of transient ischemic attack (TIA), and cerebral infarction without residual deficits: Secondary | ICD-10-CM

## 2016-09-14 LAB — BASIC METABOLIC PANEL
Anion gap: 3 — ABNORMAL LOW (ref 5–15)
BUN: 14 mg/dL (ref 6–20)
CALCIUM: 7.7 mg/dL — AB (ref 8.9–10.3)
CO2: 24 mmol/L (ref 22–32)
CREATININE: 0.92 mg/dL (ref 0.44–1.00)
Chloride: 111 mmol/L (ref 101–111)
GFR calc non Af Amer: 54 mL/min — ABNORMAL LOW (ref >60)
Glucose, Bld: 101 mg/dL — ABNORMAL HIGH (ref 65–99)
Potassium: 3.8 mmol/L (ref 3.5–5.1)
SODIUM: 138 mmol/L (ref 135–145)

## 2016-09-14 LAB — MAGNESIUM: Magnesium: 2.2 mg/dL (ref 1.7–2.4)

## 2016-09-14 LAB — CBC
HCT: 33.3 % — ABNORMAL LOW (ref 36.0–46.0)
Hemoglobin: 11.1 g/dL — ABNORMAL LOW (ref 12.0–15.0)
MCH: 28.3 pg (ref 26.0–34.0)
MCHC: 33.3 g/dL (ref 30.0–36.0)
MCV: 84.9 fL (ref 78.0–100.0)
PLATELETS: 258 10*3/uL (ref 150–400)
RBC: 3.92 MIL/uL (ref 3.87–5.11)
RDW: 14.4 % (ref 11.5–15.5)
WBC: 9.1 10*3/uL (ref 4.0–10.5)

## 2016-09-14 NOTE — Progress Notes (Signed)
[id number]/tct-Daughter Dr. Erskine SquibbJane Bird/will not need any hhc services patient already has 24 hour care givers that can do any needs that may arise/informed Dr. Roda ShuttersXU of this/Rhonda Davis,BSN,RN3,CCM.

## 2016-09-14 NOTE — Progress Notes (Signed)
PROGRESS NOTE  Laura CopaVivian Bird [id number] DOB: 02/23/1925 DOA: 09/12/2016 PCP: Herschel SenegalHABER, MICHELE, MD  HPI/Recap of past 24 hours:  Demented elderly female not able to provide history, seems more interactive today  daughter report now patient is back to her baseline  Assessment/Plan: Principal Problem:   Sepsis (HCC) Active Problems:   Dementia   Hypothyroidism   DVT, lower extremity (HCC)   History of stroke   Infection of urinary tract  Sepsis with fever, leukocytosis, metabolic encephalopathy, source of infection likely from uti Ct head no acute findings,  Urine culture + ecoli, blood culture no growth, she is started empirically on rocephin since admission,  Better , fever and leukocytosis resolved, mental status close to baseline per daughter    Hypothyroidism Continue Synthroid.    DVT, lower extremity (HCC) Continue Xarelto.    History of stroke Continue risk factor modification. On statin therapy  Advanced Dementia/FTT Continue Zoloft and Ativan as needed. Baseline wheelchair bound, total care at home  Code Status: full, confirmed with daughter  Family Communication: patient and daughter at bedside  Disposition Plan: home on 11/25   Consultants:  none  Procedures:  none  Antibiotics:  Rocephin from admission   Objective: BP 138/71 (BP Location: Left Arm)   Pulse 75   Temp 98.6 F (37 C) (Axillary)   Resp 20   Ht 5\' 3"  (1.6 m)   Wt 72.9 kg (160 lb 11.5 oz)   SpO2 97%   BMI 28.47 kg/m   Intake/Output Summary (Last 24 hours) at 09/14/16 1536 Last data filed at 09/14/16 [id number]  Gross per 24 hour  Intake              240 ml  Output                0 ml  Net              240 ml   Filed Weights   09/12/16 2147 09/12/16 2346  Weight: 70.3 kg (155 lb) 72.9 kg (160 lb 11.5 oz)    Exam:   General:  Demented elderly female, does not seem in distress  Cardiovascular: RRR  Respiratory: CTABL  Abdomen: Soft/ND/NT, positive  BS  Musculoskeletal: No Edema  Neuro: advanced dementia, only oriented to self, does not recognize daughter  Data Reviewed: Basic Metabolic Panel:  Recent Labs Lab 09/12/16 2015 09/12/16 2021 09/14/16 0546  NA 140 140 138  K 4.2 4.2 3.8  CL 106 103 111  CO2 25  --  24  GLUCOSE 136* 135* 101*  BUN 15 14 14   CREATININE 0.96 0.90 0.92  CALCIUM 9.1  --  7.7*  MG  --   --  2.2   Liver Function Tests:  Recent Labs Lab 09/12/16 2015  AST 20  ALT 14  ALKPHOS 53  BILITOT 0.8  PROT 7.3  ALBUMIN 4.0    Recent Labs Lab 09/12/16 2015  LIPASE 27   No results for input(s): AMMONIA in the last 168 hours. CBC:  Recent Labs Lab 09/12/16 2015 09/12/16 2021 09/14/16 0546  WBC 16.0*  --  9.1  NEUTROABS 12.8*  --   --   HGB 13.7 13.9 11.1*  HCT 40.3 41.0 33.3*  MCV 85.0  --  84.9  PLT 309  --  258   Cardiac Enzymes:   No results for input(s): CKTOTAL, CKMB, CKMBINDEX, TROPONINI in the last 168 hours. BNP (last 3 results) No results for input(s): BNP in the last 8760  hours.  ProBNP (last 3 results) No results for input(s): PROBNP in the last 8760 hours.  CBG: No results for input(s): GLUCAP in the last 168 hours.  Recent Results (from the past 240 hour(s))  Urine culture     Status: Abnormal (Preliminary result)   Collection Time: 09/12/16  8:45 PM  Result Value Ref Range Status   Specimen Description URINE, CATHETERIZED  Final   Special Requests NONE  Final   Culture (A)  Final    >=100,000 COLONIES/mL ESCHERICHIA COLI SUSCEPTIBILITIES TO FOLLOW [facility]    Report Status PENDING  Incomplete     Studies: No results found.  Scheduled Meds: . cefTRIAXone (ROCEPHIN)  IV  1 g Intravenous Q24H  . chlorhexidine  5 mL Mouth/Throat BID  . cholecalciferol  1,000 Units Oral Daily  . levothyroxine  112 mcg Oral Once per day on Mon Wed Fri Sat  . loratadine  10 mg Oral Daily  . LORazepam  0.5 mg Oral 3 times per day  . LORazepam  1 mg  Oral Q24H  . omega-3 acid ethyl esters  1 g Oral Daily  . polyethylene glycol  17 g Oral Daily  . rivaroxaban  20 mg Oral Q1200  . sertraline  75 mg Oral q1800  . simvastatin  20 mg Oral q1800  . vitamin E  400 Units Oral Daily    Continuous Infusions:    Time spent: 25mins  Annetta Deiss MD, PhD  [facility] Pager [phone]. If 7PM-7AM, please contact night-coverage at [email or web address], password [name] 09/14/2016, 3:36 PM  LOS: 2 days

## 2016-09-15 LAB — URINE CULTURE

## 2016-09-15 MED ORDER — SULFAMETHOXAZOLE-TRIMETHOPRIM 800-160 MG PO TABS: 1.0000 | ORAL_TABLET | Freq: Two times a day (BID) | ORAL | 0 refills | Status: AC

## 2016-09-15 NOTE — Progress Notes (Signed)
Pt AVS reviewed at bedside w/ daughter. Medication administration also reviewed, no questions. IV removed. VSS. Justin Mendaudle, Tamme Mozingo H, RN

## 2016-09-15 NOTE — Care Management Note (Signed)
Case Management Note  Patient Details  Name: Cammy CopaVivian Steckman MRN: [id number] Date of Birth: 03/28/1925  Subjective/Objective:  Sepsis, metabolic encephalopathy                  Action/Plan: Discharge Planning: AVS reviewed: Please see previous NCM notes. Daughter refused HH, pt has 24 hour caregivers in the home.    Expected Discharge Date:  09/15/2016               Expected Discharge Plan:  Home w Home Health Services  In-House Referral:  NA  Discharge planning Services  CM Consult  Post Acute Care Choice:  Home Health Choice offered to:  Adult Children  DME Arranged:  N/A DME Agency:  NA  HH Arranged:  Patient Refused HH Agency:  NA  Status of Service:  Completed, signed off  If discussed at Long Length of Stay Meetings, dates discussed:    Additional Comments:  Elliot CousinShavis, Marielena Harvell Ellen, RN 09/15/2016, 9:54 AM

## 2016-09-15 NOTE — Discharge Summary (Signed)
Discharge Summary  Laura Bird [id number] DOB: 06/18/1925  PCP: Herschel SenegalHABER, MICHELE, MD  Admit date: 09/12/2016 Discharge date: 09/15/2016  Time spent: <6030mins  Recommendations for Outpatient Follow-up:  1. F/u with PMD within a week  for hospital discharge follow up  Discharge Diagnoses:  Active Hospital Problems   Diagnosis Date Noted  . Sepsis (HCC) 12/23/2015  . History of stroke 09/13/2016  . Infection of urinary tract 09/13/2016  . Dementia 12/23/2015  . DVT, lower extremity (HCC) 12/23/2015  . Hypothyroidism 12/23/2015    Resolved Hospital Problems   Diagnosis Date Noted Date Resolved  No resolved problems to display.    Discharge Condition: stable  Diet recommendation: heart healthy  Filed Weights   09/12/16 2147 09/12/16 2346  Weight: 70.3 kg (155 lb) 72.9 kg (160 lb 11.5 oz)    History of present illness:  Laura Bird is an 82 y.o. female with a PMH of stroke, DVT on chronic anticoagulation, and advanced dementia who was brought to the hospital for evaluation by her caregiver secondary to the onset of generalized weakness and changes in her baseline level of mental status that began earlier today. Patient is unable to provide any additional history. According to the caregiver, she attempted to get her off the commode and the patient was unable to stand up. She seemed to have trouble moving her left leg. EMS was called and patient was combative with the ambulance personnel. At baseline, the patient is wheelchair-bound.  ED Course:  The patient was felt to have early sepsis. She did not have any focal neurological deficits to suggest stroke. She was febrile with an elevated respiratory rate. Source likely urinary system. Chest x-ray negative for acute cardiopulmonary findings. CT of the head negative for intracranial abnormalities. Labs notable for a WBC of 16. Lactic acid 1.73. Urinalysis showed turbid urine with positive nitrites and a large amount of leukocytes.  She was subsequently placed on empiric Rocephin.  Hospital Course:  Principal Problem:   Sepsis (HCC) Active Problems:   Dementia   Hypothyroidism   DVT, lower extremity (HCC)   History of stroke   Infection of urinary tract   Sepsis with fever, leukocytosis, metabolic encephalopathy, source of infection likely from uti Ct head no acute findings,  Urine culture + pan sensitive ecoli, blood culture no growth,  she is started empirically on rocephin since admission,  Better , fever and leukocytosis resolved, mental status close to baseline per daughter She is discharged home on bactrim for another three days.    Hypothyroidism Continue Synthroid.  DVT, lower extremity (HCC) Continue Xarelto.  History of stroke Continue risk factor modification. On statin therapy  Advanced Dementia/FTT Continue Zoloft and Ativan as needed. Baseline wheelchair bound, total care at home Family declined home health arrangement  Code Status: full, confirmed with daughter  Family Communication: patient and daughter at bedside  Disposition Plan: home on 11/25   Consultants:  none  Procedures:  none  Antibiotics:  Rocephin from admission    Discharge Exam: BP 124/81 (BP Location: Left Arm)   Pulse 79   Temp 98 F (36.7 C) (Axillary)   Resp 19   Ht 5\' 3"  (1.6 m)   Wt 72.9 kg (160 lb 11.5 oz)   SpO2 96%   BMI 28.47 kg/m     General:  Demented elderly female, does not seem in distress  Cardiovascular: RRR  Respiratory: CTABL  Abdomen: Soft/ND/NT, positive BS  Musculoskeletal: No Edema  Neuro: advanced dementia, only oriented to self,  does not recognize daughter   Discharge Instructions You were cared for by a hospitalist during your hospital stay. If you have any questions about your discharge medications or the care you received while you were in the hospital after you are discharged, you can call the unit and asked to speak with the  hospitalist on call if the hospitalist that took care of you is not available. Once you are discharged, your primary care physician will handle any further medical issues. Please note that NO REFILLS for any discharge medications will be authorized once you are discharged, as it is imperative that you return to your primary care physician (or establish a relationship with a primary care physician if you do not have one) for your aftercare needs so that they can reassess your need for medications and monitor your lab values.  Discharge Instructions    Diet general    Complete by:  As directed    Soft diet   Face-to-face encounter (required for Medicare/Medicaid patients)    Complete by:  As directed    I Laura Bird certify that this patient is under my care and that I, or a nurse practitioner or physician's assistant working with me, had a face-to-face encounter that meets the physician face-to-face encounter requirements with this patient on 09/14/2016. The encounter with the patient was in whole, or in part for the following medical condition(s) which is the primary reason for home health care (List medical condition): FTT   The encounter with the patient was in whole, or in part, for the following medical condition, which is the primary reason for home health care:  FTT   I certify that, based on my findings, the following services are medically necessary home health services:   Nursing Physical therapy     Reason for Medically Necessary Home Health Services:  Skilled Nursing- Change/Decline in Patient Status   My clinical findings support the need for the above services:  Cognitive impairments, dementia, or mental confusion  that make it unsafe to leave home   Further, I certify that my clinical findings support that this patient is homebound due to:  Mental confusion   Home Health    Complete by:  As directed    To provide the following care/treatments:   PT OT RN Home Health Aide      Increase activity slowly    Complete by:  As directed        Medication List    STOP taking these medications   cefpodoxime 200 MG tablet Commonly known as:  VANTIN   dextromethorphan-guaiFENesin 10-100 MG/5ML liquid Commonly known as:  TUSSIN DM   oseltamivir 30 MG capsule Commonly known as:  TAMIFLU     TAKE these medications   cetirizine 10 MG tablet Commonly known as:  ZYRTEC Take 10 mg by mouth daily.   chlorhexidine 0.12 % solution Commonly known as:  PERIDEX Use as directed 5 mLs in the mouth or throat 2 (two) times daily.   cholecalciferol 1000 units tablet Commonly known as:  VITAMIN D Take 1,000 Units by mouth daily.   Fish Oil 1200 MG Caps Take 1,200 mg by mouth daily.   levothyroxine 112 MCG tablet Commonly known as:  SYNTHROID, LEVOTHROID Take 112 mcg by mouth daily before breakfast. Monday, Wednesday, Friday and Saturday.   LORazepam 0.5 MG tablet Commonly known as:  ATIVAN Take 0.5-1 mg by mouth 4 (four) times daily. Take 0.5 mg in the morning, Take 1 mg at noon. Take  0.5 in the afternoon and evening.   Melatonin 3 MG Caps Take 3 mg by mouth at bedtime.   polyethylene glycol packet Commonly known as:  MIRALAX / GLYCOLAX Take 17 g by mouth daily.   sertraline 50 MG tablet Commonly known as:  ZOLOFT Take 75 mg by mouth daily at 6 PM.   simvastatin 20 MG tablet Commonly known as:  ZOCOR Take 20 mg by mouth daily at 6 PM.   sulfamethoxazole-trimethoprim 800-160 MG tablet Commonly known as:  BACTRIM DS,SEPTRA DS Take 1 tablet by mouth 2 (two) times daily.   vitamin E 400 UNIT capsule Take 400 Units by mouth daily.   XARELTO 20 MG Tabs tablet Generic drug:  rivaroxaban Take 20 mg by mouth daily at 12 noon.      Allergies  Allergen Reactions  . Codeine Other (See Comments)    unknown  . Penicillins Other (See Comments)      Has patient had a PCN reaction causing immediate rash, facial/tongue/throat swelling, SOB or lightheadedness  with hypotension: very mild rash Has patient had a PCN reaction causing severe rash involving mucus membranes or skin necrosis: unknown Has patient had a PCN reaction that required hospitalization unknown Has patient had a PCN reaction occurring within the last 10 years: unknown If all of the above answers are "NO", then may proceed with Cephalosporin use.   Follow-up Information    HABER, MICHELE, MD Follow up in 1 week(s).   Specialties:  Internal Medicine, Geriatric Medicine Why:  hospital discharge follow up, Contact information: [address] [place] [address] [phone]            The results of significant diagnostics from this hospitalization (including imaging, microbiology, ancillary and laboratory) are listed below for reference.    Significant Diagnostic Studies: Ct Head Wo Contrast  Result Date: 09/12/2016 CLINICAL DATA:  More confusion, baseline dementia of EXAM: CT HEAD WITHOUT CONTRAST TECHNIQUE: Contiguous axial images were obtained from the base of the skull through the vertex without intravenous contrast. COMPARISON:  12/23/2015 FINDINGS: Brain: There is no acute territorial infarction, intracranial hemorrhage or extra-axial fluid collection identified. There is no focal mass, mass effect or midline shift. Moderate periventricular, subcortical and deep white matter hypodensities consistent with small vessel disease, similar compared to previous exam. Moderate atrophy. Ventricular enlargement similar compared to prior and felt secondary to atrophy. Vascular: No hyperdense vessels. Carotid artery calcifications are present. Skull: Mastoid air cells are clear. There is no skull fracture identified. Sinuses/Orbits: Imaged paranasal sinuses appear clear. Limited visualization of the orbits. Other: None IMPRESSION: 1. No CT evidence for acute intracranial abnormality. 2. Atrophy. Moderate periventricular, subcortical and deep white matter small vessel ischemic changes.  Electronically Signed   By: Jasmine Pang M.D.   On: 09/12/2016 20:41   Dg Chest Port 1 View  Result Date: 09/12/2016 CLINICAL DATA:  Worsening confusion. Combative. History of dementia. EXAM: PORTABLE CHEST 1 VIEW COMPARISON:  Chest radiograph December 23, 2015 FINDINGS: The cardiac silhouette is mildly enlarged and unchanged. Mildly calcified aortic knob. No pleural effusion or focal consolidation. No pneumothorax. Soft tissue planes and included osseous structures are nonsuspicious. Posterior LEFT humeral head deformity most compatible with Hill-Sachs deformity. IMPRESSION: Mild cardiomegaly. No acute pulmonary process for this low inspiratory portable examination. Electronically Signed   By: Awilda Metro M.D.   On: 09/12/2016 20:04    Microbiology: Recent Results (from the past 240 hour(s))  Urine culture     Status: Abnormal   Collection  Time: 09/12/16  8:45 PM  Result Value Ref Range Status   Specimen Description URINE, CATHETERIZED  Final   Special Requests NONE  Final   Culture >=100,000 COLONIES/mL ESCHERICHIA COLI (A)  Final   Report Status 09/15/2016 FINAL  Final   Organism ID, Bacteria ESCHERICHIA COLI (A)  Final      Susceptibility   Escherichia coli - MIC*    AMPICILLIN <=2 SENSITIVE Sensitive     CEFAZOLIN <=4 SENSITIVE Sensitive     CEFTRIAXONE <=1 SENSITIVE Sensitive     CIPROFLOXACIN <=0.25 SENSITIVE Sensitive     GENTAMICIN <=1 SENSITIVE Sensitive     IMIPENEM <=0.25 SENSITIVE Sensitive     NITROFURANTOIN <=16 SENSITIVE Sensitive     TRIMETH/SULFA <=20 SENSITIVE Sensitive     AMPICILLIN/SULBACTAM <=2 SENSITIVE Sensitive     PIP/TAZO <=4 SENSITIVE Sensitive     Extended ESBL NEGATIVE Sensitive     * >=100,000 COLONIES/mL ESCHERICHIA COLI  Culture, blood (routine x 2)     Status: None (Preliminary result)   Collection Time: 09/13/16 10:28 AM  Result Value Ref Range Status   Specimen Description BLOOD LEFT ARM  Final   Special Requests IN PEDIATRIC BOTTLE 1 CC   Final   Culture   Final    NO GROWTH 1 DAY [facility]    Report Status PENDING  Incomplete  Culture, blood (routine x 2)     Status: None (Preliminary result)   Collection Time: 09/13/16 10:32 AM  Result Value Ref Range Status   Specimen Description BLOOD LEFT HAND  Final   Special Requests IN PEDIATRIC BOTTLE 1.5 CC  Final   Culture   Final    NO GROWTH 1 DAY [facility]    Report Status PENDING  Incomplete     Labs: Basic Metabolic Panel:  Recent Labs Lab 09/12/16 2015 09/12/16 2021 09/14/16 0546  NA 140 140 138  K 4.2 4.2 3.8  CL 106 103 111  CO2 25  --  24  GLUCOSE 136* 135* 101*  BUN 15 14 14   CREATININE 0.96 0.90 0.92  CALCIUM 9.1  --  7.7*  MG  --   --  2.2   Liver Function Tests:  Recent Labs Lab 09/12/16 2015  AST 20  ALT 14  ALKPHOS 53  BILITOT 0.8  PROT 7.3  ALBUMIN 4.0    Recent Labs Lab 09/12/16 2015  LIPASE 27   No results for input(s): AMMONIA in the last 168 hours. CBC:  Recent Labs Lab 09/12/16 2015 09/12/16 2021 09/14/16 0546  WBC 16.0*  --  9.1  NEUTROABS 12.8*  --   --   HGB 13.7 13.9 11.1*  HCT 40.3 41.0 33.3*  MCV 85.0  --  84.9  PLT 309  --  258   Cardiac Enzymes: No results for input(s): CKTOTAL, CKMB, CKMBINDEX, TROPONINI in the last 168 hours. BNP: BNP (last 3 results) No results for input(s): BNP in the last 8760 hours.  ProBNP (last 3 results) No results for input(s): PROBNP in the last 8760 hours.  CBG: No results for input(s): GLUCAP in the last 168 hours.     SignedAlbertine Grates:  Kamaal Cast MD, PhD  [facility] 09/15/2016, 8:54 AM

## 2016-09-17 LAB — OCCULT BLOOD, POC DEVICE: FECAL OCCULT BLD: POSITIVE — AB

## 2016-09-18 LAB — CULTURE, BLOOD (ROUTINE X 2)
CULTURE: NO GROWTH
Culture: NO GROWTH

## 2018-06-17 IMAGING — CT CT HEAD W/O CM
3 of 4 series · 15 of 47 positions shown, 18 images · non-contrast
Comparison: 12/23/2015

CLINICAL DATA: More confusion, baseline dementia of

EXAM:
CT HEAD WITHOUT CONTRAST
TECHNIQUE: Contiguous axial images were obtained from the base of the skull
through the vertex without intravenous contrast.

[Series 2: head w/o · axial · non-contrast · 0.39mm/px · z∈[-192,-72]mm · 9 of 29 slices shown, 12 images]
[im 3/29  brain]
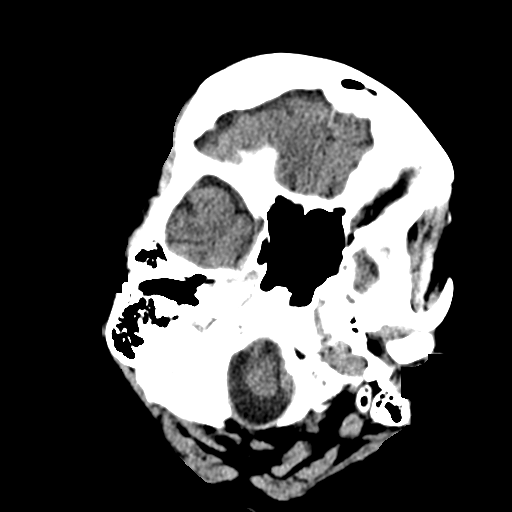
[im 3/29  bone]
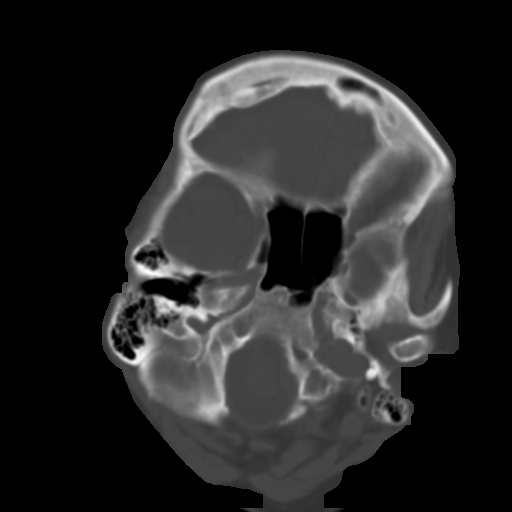
[im 7/29  brain]
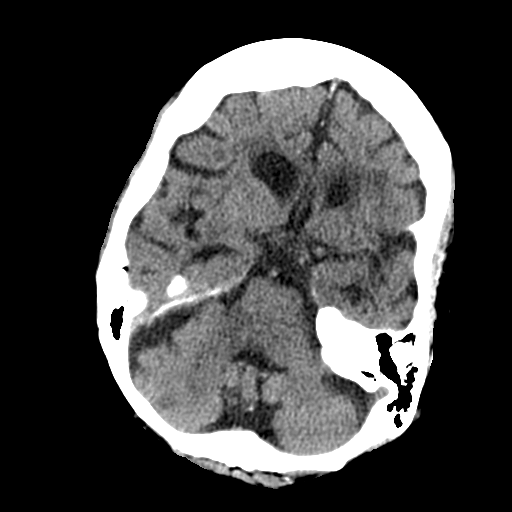
[im 9/29  brain]
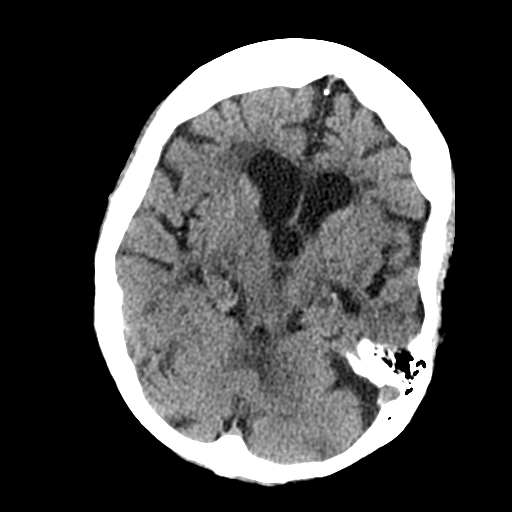
[im 13/29  brain]
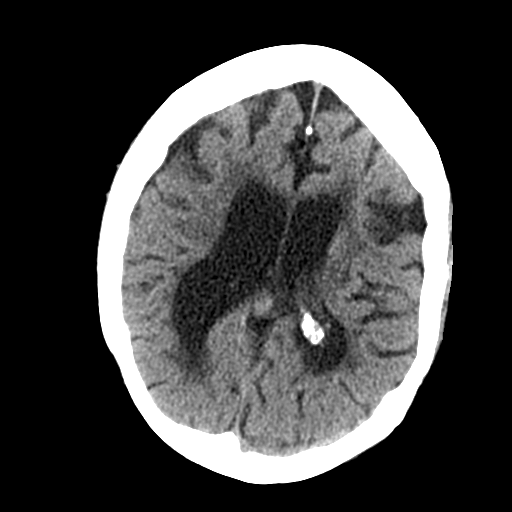
[im 15/29  brain]
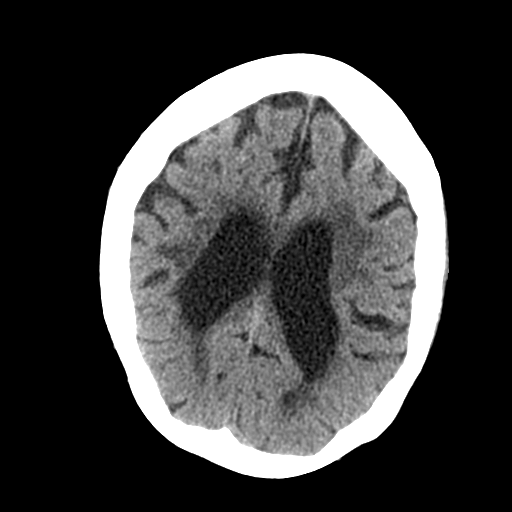
[im 15/29  bone]
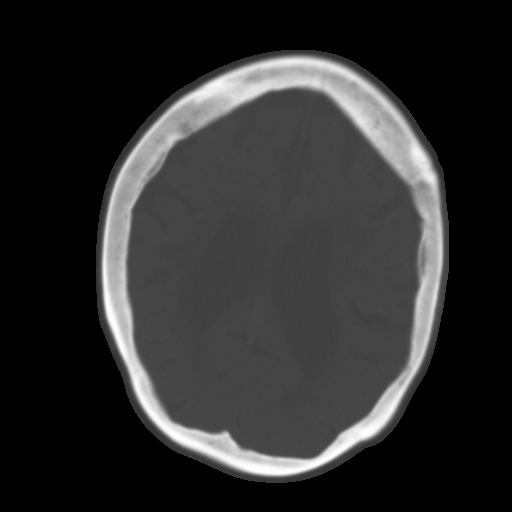
[im 17/29  brain]
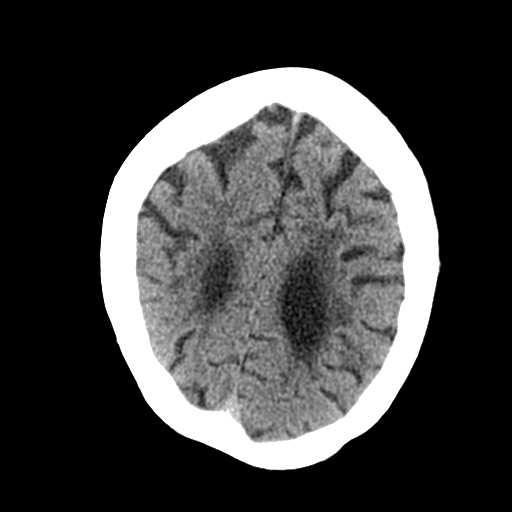
[im 21/29  brain]
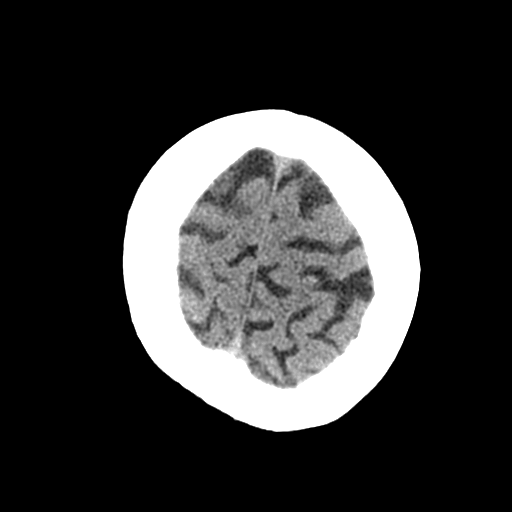
[im 23/29  brain]
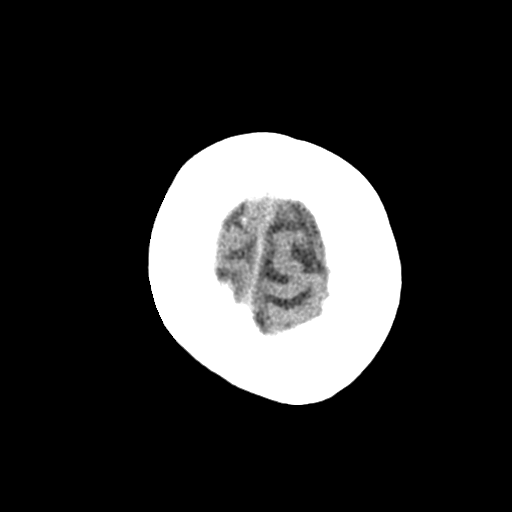
[im 27/29  brain]
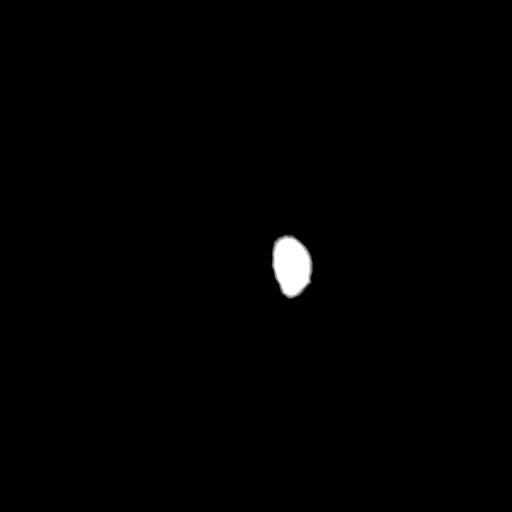
[im 27/29  bone]
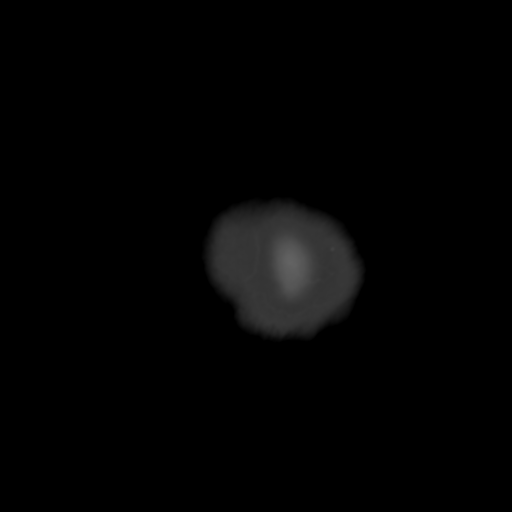

[Series 5: coronal · coronal · 0.26mm/px · 3 of 63 slices shown]
[im 21/63  brain]
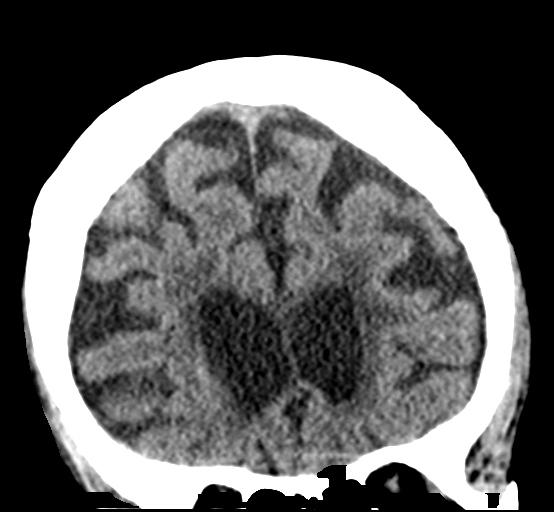
[im 28/63  brain]
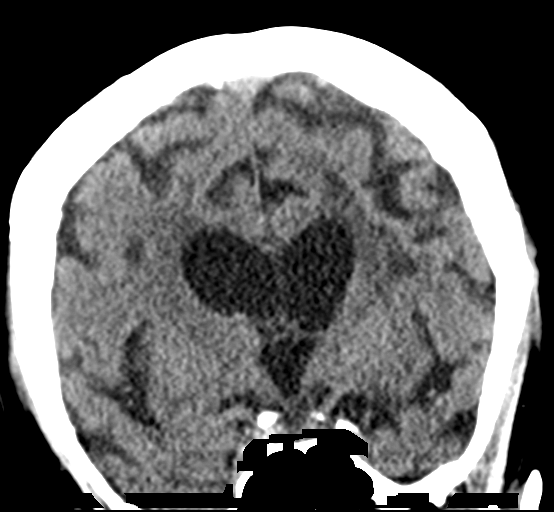
[im 35/63  brain]
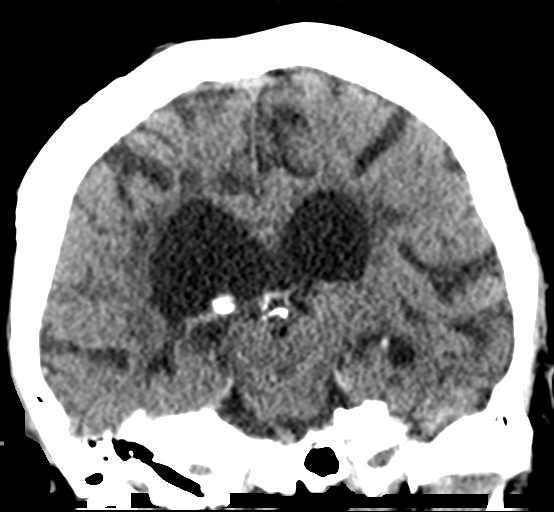

[Series 6: sagittal · sagittal · 0.26mm/px · 3 of 53 slices shown]
[im 18/53  brain]
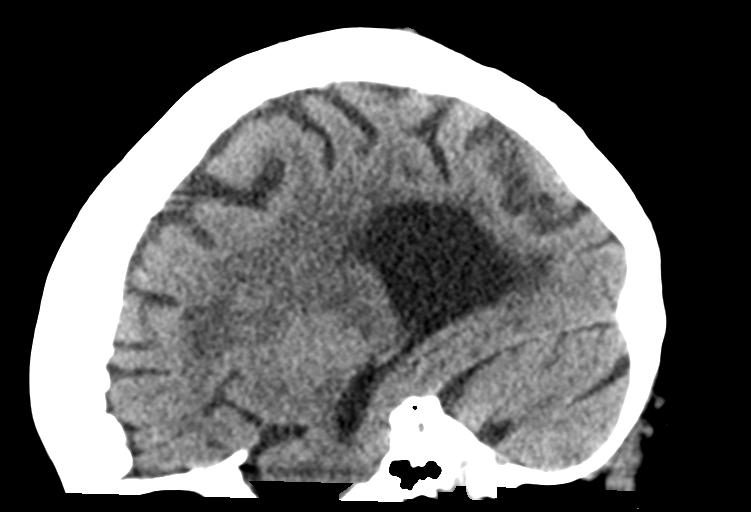
[im 27/53  brain]
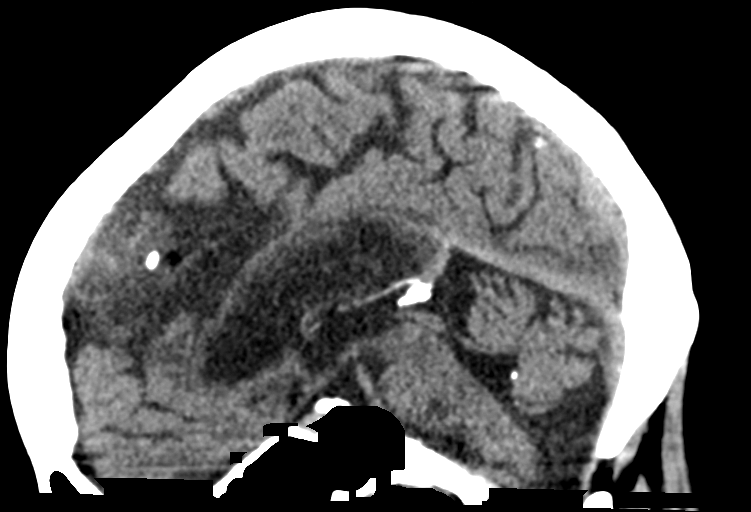
[im 35/53  brain]
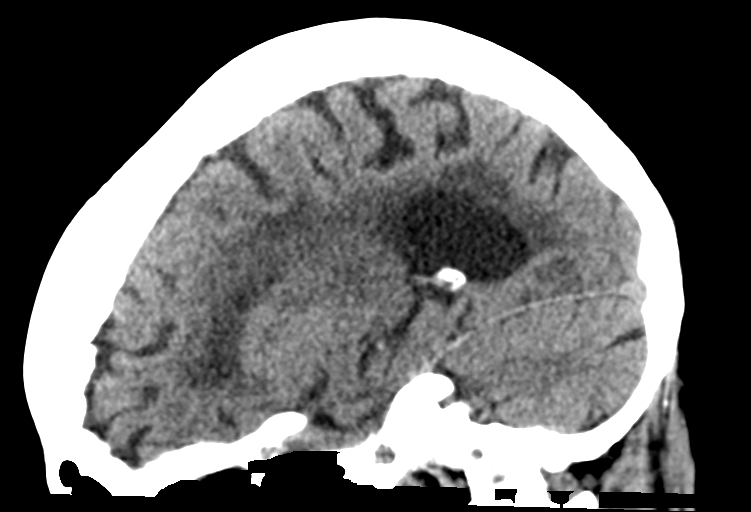

[15 of 47 positions shown; findings below may reference images not displayed]

FINDINGS: Brain: There is no acute territorial infarction, intracranial
hemorrhage or extra-axial fluid collection identified. There is no
focal mass, mass effect or midline shift. Moderate periventricular,
subcortical and deep white matter hypodensities consistent with
small vessel disease, similar compared to previous exam. Moderate
atrophy. Ventricular enlargement similar compared to prior and felt
secondary to atrophy.

Vascular: No hyperdense vessels. Carotid artery calcifications are
present.

Skull: Mastoid air cells are clear. There is no skull fracture
identified.

Sinuses/Orbits: Imaged paranasal sinuses appear clear. Limited
visualization of the orbits.

Other: None
IMPRESSION: 1. No CT evidence for acute intracranial abnormality.
2. Atrophy. Moderate periventricular, subcortical and deep white
matter small vessel ischemic changes.

## 2018-06-17 IMAGING — DX DG CHEST 1V PORT
1 series · 1 of 1 positions shown · non-contrast
Comparison: Chest radiograph December 23, 2015

CLINICAL DATA: Worsening confusion. Combative. History of dementia.

EXAM:
PORTABLE CHEST 1 VIEW

[chest ap]
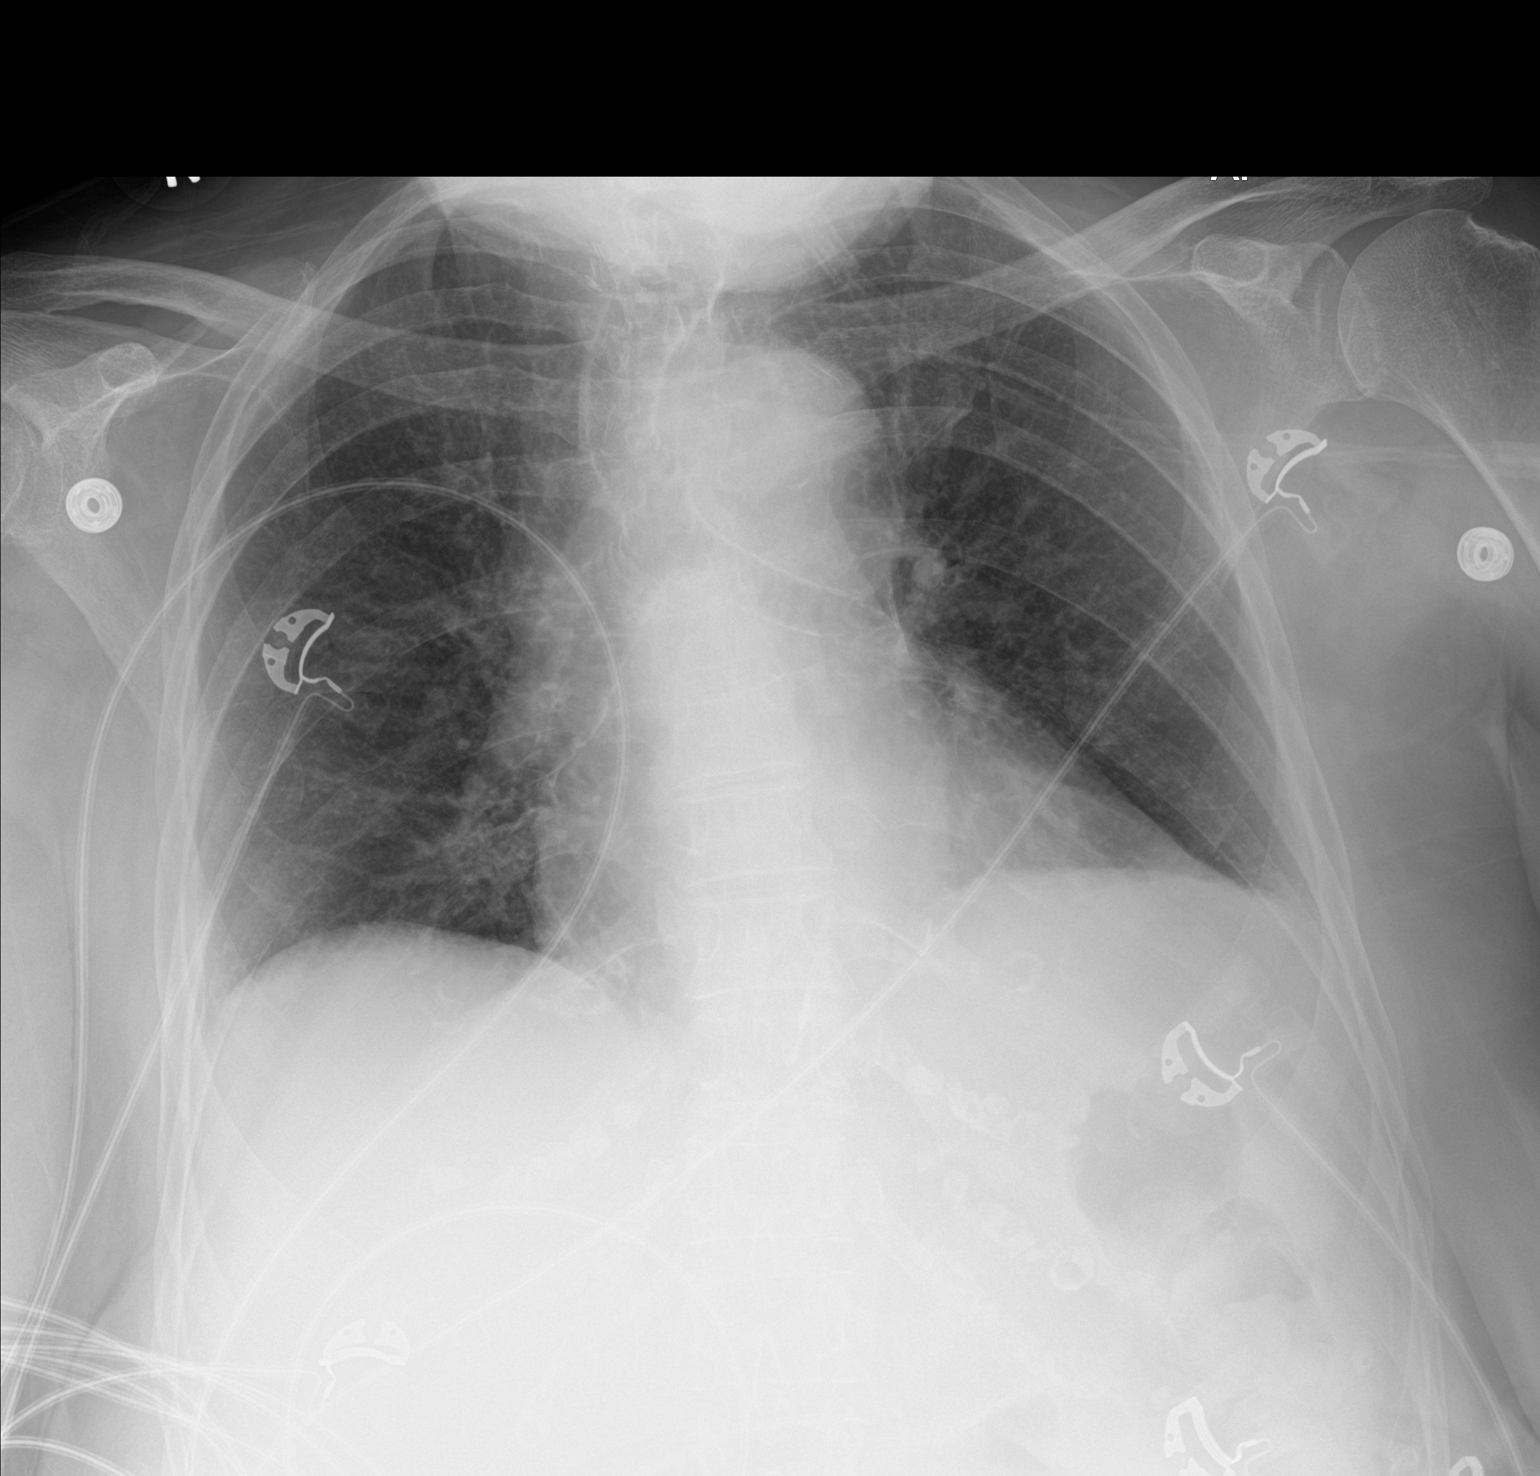

[1 of 1 positions shown; findings below may reference images not displayed]

FINDINGS: The cardiac silhouette is mildly enlarged and unchanged. Mildly
calcified aortic knob. No pleural effusion or focal consolidation.
No pneumothorax. Soft tissue planes and included osseous structures
are nonsuspicious. Posterior LEFT humeral head deformity most
compatible with Hill-Sachs deformity.
IMPRESSION: Mild cardiomegaly. No acute pulmonary process for this low
inspiratory portable examination.

## 2019-11-22 ENCOUNTER — Ambulatory Visit: Payer: Medicare Other

## 2019-11-27 ENCOUNTER — Ambulatory Visit: Payer: Medicare Other

## 2019-11-28 ENCOUNTER — Ambulatory Visit: Payer: Medicare Other

## 2019-12-05 ENCOUNTER — Ambulatory Visit: Payer: Medicare Other | Attending: Internal Medicine

## 2019-12-05 DIAGNOSIS — Z23 Encounter for immunization: Secondary | ICD-10-CM | POA: Insufficient documentation

## 2019-12-05 NOTE — Progress Notes (Signed)
   Covid-19 Vaccination Clinic  Name:  Laura Bird    MRN: [id number] DOB: 20-Sep-1925  12/05/2019  Laura Bird was observed post Covid-19 immunization for 15 minutes without incidence. She was provided with Vaccine Information Sheet and instruction to access the V-Safe system.   Laura Bird was instructed to call 911 with any severe reactions post vaccine: Marland Kitchen Difficulty breathing  . Swelling of your face and throat  . A fast heartbeat  . A bad rash all over your body  . Dizziness and weakness    Immunizations Administered    Name Date Dose VIS Date Route   Pfizer COVID-19 Vaccine 12/05/2019  8:23 AM 0.3 mL 10/02/2019 Intramuscular   Manufacturer: [employer], [employer]   Lot: [id number]   NDC: [address]-3

## 2019-12-28 ENCOUNTER — Ambulatory Visit: Payer: Medicare Other | Attending: Internal Medicine

## 2019-12-28 DIAGNOSIS — Z23 Encounter for immunization: Secondary | ICD-10-CM | POA: Insufficient documentation

## 2019-12-28 NOTE — Progress Notes (Signed)
   Covid-19 Vaccination Clinic  Name:  Raquelle Pietro    MRN: [id number] DOB: [birth year]  12/28/2019  Ms. Levett was observed post Covid-19 immunization for 15 minutes without incident. She was provided with Vaccine Information Sheet and instruction to access the V-Safe system.   Ms. Laughery was instructed to call 911 with any severe reactions post vaccine: Marland Kitchen Difficulty breathing  . Swelling of face and throat  . A fast heartbeat  . A bad rash all over body  . Dizziness and weakness   Immunizations Administered    Name Date Dose VIS Date Route   Pfizer COVID-19 Vaccine 12/28/2019  9:53 AM 0.3 mL 10/02/2019 Intramuscular   Manufacturer: [employer], [employer]   Lot: [id number]   NDC: [address]-9
